# Patient Record
Sex: Female | Born: 1980 | Race: White | Hispanic: No | Marital: Single | Smoking: Current every day smoker
Health system: Southern US, Community
[De-identification: ages and names within clinical notes are randomized; demographics above are authoritative.]

## PROBLEM LIST (undated history)

## (undated) HISTORY — PX: OVARY SURGERY: SHX727

## (undated) HISTORY — PX: CYSTECTOMY: SUR359

## (undated) HISTORY — PX: LIGAMENT REPAIR: SHX5444

## (undated) HISTORY — PX: APPENDECTOMY: SHX54

---
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
## 2003-10-21 ENCOUNTER — Emergency Department (HOSPITAL_COMMUNITY): Admission: EM | Admit: 2003-10-21 | Discharge: 2003-10-21 | Payer: Self-pay | Admitting: Emergency Medicine

## 2003-11-16 ENCOUNTER — Emergency Department (HOSPITAL_COMMUNITY): Admission: EM | Admit: 2003-11-16 | Discharge: 2003-11-16 | Payer: Self-pay | Admitting: Family Medicine

## 2006-12-19 ENCOUNTER — Emergency Department (HOSPITAL_COMMUNITY): Admission: EM | Admit: 2006-12-19 | Discharge: 2006-12-19 | Payer: Self-pay | Admitting: Emergency Medicine

## 2007-05-01 ENCOUNTER — Emergency Department (HOSPITAL_COMMUNITY): Admission: EM | Admit: 2007-05-01 | Discharge: 2007-05-01 | Payer: Self-pay | Admitting: Emergency Medicine

## 2007-07-10 ENCOUNTER — Emergency Department (HOSPITAL_COMMUNITY): Admission: EM | Admit: 2007-07-10 | Discharge: 2007-07-10 | Payer: Self-pay | Admitting: Emergency Medicine

## 2007-07-24 ENCOUNTER — Ambulatory Visit: Payer: Self-pay | Admitting: Obstetrics and Gynecology

## 2007-07-24 ENCOUNTER — Encounter: Payer: Self-pay | Admitting: Obstetrics and Gynecology

## 2007-07-25 ENCOUNTER — Ambulatory Visit: Payer: Self-pay | Admitting: Gynecology

## 2007-07-25 ENCOUNTER — Inpatient Hospital Stay (HOSPITAL_COMMUNITY): Admission: AD | Admit: 2007-07-25 | Discharge: 2007-08-02 | Payer: Self-pay | Admitting: Obstetrics & Gynecology

## 2007-07-25 ENCOUNTER — Encounter: Payer: Self-pay | Admitting: Obstetrics & Gynecology

## 2007-08-07 ENCOUNTER — Ambulatory Visit: Payer: Self-pay | Admitting: Obstetrics & Gynecology

## 2007-09-11 ENCOUNTER — Ambulatory Visit: Payer: Self-pay | Admitting: Obstetrics & Gynecology

## 2008-05-22 ENCOUNTER — Emergency Department (HOSPITAL_COMMUNITY): Admission: EM | Admit: 2008-05-22 | Discharge: 2008-05-22 | Payer: Self-pay | Admitting: Family Medicine

## 2009-11-16 ENCOUNTER — Emergency Department (HOSPITAL_COMMUNITY): Admission: EM | Admit: 2009-11-16 | Discharge: 2009-11-16 | Payer: Self-pay | Admitting: Emergency Medicine

## 2010-03-06 ENCOUNTER — Encounter: Payer: Self-pay | Admitting: *Deleted

## 2010-04-28 LAB — CBC
MCV: 93.9 fL (ref 78.0–100.0)
Platelets: 119 10*3/uL — ABNORMAL LOW (ref 150–400)
RBC: 3.94 MIL/uL (ref 3.87–5.11)
RDW: 11.8 % (ref 11.5–15.5)
WBC: 8.2 10*3/uL (ref 4.0–10.5)

## 2010-04-28 LAB — DIFFERENTIAL
Basophils Absolute: 0 10*3/uL (ref 0.0–0.1)
Eosinophils Relative: 2 % (ref 0–5)
Lymphocytes Relative: 14 % (ref 12–46)
Lymphs Abs: 1.1 10*3/uL (ref 0.7–4.0)
Neutro Abs: 6.4 10*3/uL (ref 1.7–7.7)

## 2010-04-28 LAB — BASIC METABOLIC PANEL
Chloride: 112 mEq/L (ref 96–112)
Creatinine, Ser: 0.64 mg/dL (ref 0.4–1.2)
GFR calc Af Amer: >60 mL/min (ref >60)
GFR calc non Af Amer: >60 mL/min (ref >60)
Potassium: 4.1 mEq/L (ref 3.5–5.1)

## 2010-04-28 LAB — GLUCOSE, CAPILLARY

## 2010-04-28 LAB — POCT PREGNANCY, URINE: Preg Test, Ur: NEGATIVE

## 2010-05-25 LAB — POCT PREGNANCY, URINE: Preg Test, Ur: NEGATIVE

## 2010-05-25 LAB — POCT URINALYSIS DIP (DEVICE)
Bilirubin Urine: NEGATIVE
Glucose, UA: NEGATIVE mg/dL
Ketones, ur: 15 mg/dL — AB
Protein, ur: 100 mg/dL — AB
Specific Gravity, Urine: 1.02 (ref 1.005–1.030)

## 2010-06-28 NOTE — Group Therapy Note (Signed)
[name], [name] NO.:  [id number]   MEDICAL RECORD NO.:  [id number]          PATIENT TYPE:  WOC   LOCATION:  [facility]                   FACILITY:  [facility]   PHYSICIAN:  Elsie Lincoln, MD      DATE OF BIRTH:  1980/12/28   DATE OF SERVICE:  08/07/2007                                  CLINIC NOTE   The patient is 30 year old female who was admitted on 07/25/2007 with  acute abdominal pain.  Transvaginal ultrasound was done and was called  as ovarian torsion, went in laparoscopically; however, it was PID with  bilateral hydrosalpinx and involvement of the appendix.  The patient  then had exploratory laparotomy, right salpingectomy, appendectomy and  lysis of adhesions.  Both ovaries were normal.  The fallopian tube also  had a hydrosalpinx/pyosalpinx.  However this tube was left alone.  The  patient was placed on antibiotics, Flagyl and cefoxitin, and also  received a dose of Rocephin IM for a positive gonorrhea.  The Flagyl was  treating the PID and also her Trichomonas which was found on Pap smear.  She developed a small bowel obstruction and required an NG tube for  several days.  General surgery was consulted.  The patient did well with  bowel rest and was discharged home approximately 8-9 days after her  surgery.  She is still on metronidazole, Cipro and doxy and oxycodone  for pain today.  The patient looks remarkably better.  She is passing  gas, having bowel movements.  No nausea or vomiting and all the swelling  is gone from her abdomen.  She has informed her partner of the positive  results and is no longer sexually active with him.  We talked about  fertility and that she is at risk for ectopic given her left fallopian  tube did have infection and she will need to seek early care once a  positive pregnancy test is noted by herself.   Temperature 99, pulse 94, blood pressure 141/87, weight 133.7, height 5  feet 5 inches.  GENERAL:  Well-nourished,  well-developed, in no apparent distress.  ABDOMEN:  Soft, nontender, nondistended, no rebound or guarding.  Steri-  Strips are worn and these were removed across the Pfannenstiel skin  incision.  Her laparoscopic incisions were healing well.  There is no  discharge from the incision.   ASSESSMENT AND PLAN:  A 30 year old female with pelvic inflammatory  disease and bilateral pyosalpinx with involvement of appendix.  The  patient is status post right salpingectomy and appendectomy.   1. Continue antibiotics.  2. Condoms always.  3. Return to clinic in 4 weeks for final postop visit.           ______________________________  Elsie Lincoln, MD     KL/MEDQ  D:  08/07/2007  T:  08/07/2007  Job:  [id number]

## 2010-06-28 NOTE — Op Note (Signed)
NAMEMERRICK, FEUTZ            ACCOUNT NO.:  [id number]   MEDICAL RECORD NO.:  [id number]          PATIENT TYPE:  INP   LOCATION:                                [facility]   PHYSICIAN:  Ginger Carne, MD  DATE OF BIRTH:  [birth year]   DATE OF PROCEDURE:  07/25/2007  DATE OF DISCHARGE:                               OPERATIVE REPORT   PREOPERATIVE DIAGNOSIS:  Right adnexal mass.   POSTOPERATIVE DIAGNOSES:  1. Appendicitis with possible rupture.  2. Bilateral hydrosalpinx.   PROCEDURE:  Appendectomy.   SURGEON:  Ginger Carne, MD   ASSISTANT:  Lesly Dukes, MD   ESTIMATED BLOOD LOSS:  Minimal.   COMPLICATIONS:  None immediate.   ANESTHESIA:  General.   SPECIMEN:  Appendix to pathology.   OPERATIVE FINDINGS:  Dr. Penne Lash requested assistance for management of  a right adnexal mass.  The distal ileum was affixed to the right adnexa.  This was secondary to inflammation caused by appendicitis with possible  rupture.  The distal ileum was easily separated from the right adnexa by  blunt dissection.  The mesentery was inflamed beneath the distal ileum;  however, there was no injury or violation to the bowel proper. The  portion of the distal ileum, approximately 4 to 5 cm in length, was  indurated where the appendix was attached to the mesentary and ileum.  The right tube was dilated consistent with hydrosalpinx and purulent  material emanated from its fimbria.  Dr. Penne Lash will dictate her  portion of the procedure and this dictation focuses principally on the  appendectomy and lysis of bowel adhesions.   OPERATIVE PROCEDURE:  After exploring the pelvic cavity, using a blunt  dissection, the distal ileum was easily separated from the right adnexa.  After inspection of said bowel to ensure no injury to said structure,  the distal portion of the appendix was intact and easily separated from  the distal ileum and the mesentery.  The mesoappendix was clamped,  cut  and ligated with 0 Vicryl suture at the base.  The two hemostats were  placed and the appendix was then severed between both hemostats.  A 2-0  Vicryl was used as a pursestring suture on the first tie to tie the base  and a second one over the first to assure fastness of the tie.  The tip  of the appendix was then cleansed with  Betadine, no active bleeding noted and the knots were secured.  The  mesoappendix showed no signs of bleeding.  Copious irrigation with  lactated Ringer's followed and Dr. Penne Lash will continue with the said  dictation as far as closure and management of the right salpinx.      Ginger Carne, MD  Electronically Signed     SHB/MEDQ  D:  07/25/2007  T:  07/26/2007  Job:  [id number]

## 2010-06-28 NOTE — Op Note (Signed)
[name], [name]            ACCOUNT NO.:  [id number]   MEDICAL RECORD NO.:  [id number]          PATIENT TYPE:  INP   LOCATION:  9319                          [facility]   PHYSICIAN:  Lesly Dukes, M.D. DATE OF BIRTH:  March 30, 1980   DATE OF PROCEDURE:  07/25/2007  DATE OF DISCHARGE:                               OPERATIVE REPORT   PREOPERATIVE DIAGNOSIS:  A 30 year old female with radiological  diagnosis of an 8-cm ovarian cyst, possible torsion.   POSTOPERATIVE DIAGNOSES:  Bilateral pyosalpinx, grossly normal ovaries  bilaterally, possible, and adhesive disease involving the bowel,  mesentery, fallopian tube, and posterior side of the uterus.   PROCEDURE:  1. Diagnostic laparoscopy.  2. Exploratory laparotomy.  3. Right salpingectomy.  4. Appendectomy.  5. Lysis of adhesions.   SURGEON:  Lesly Dukes, MD   ASSISTANT:  Ginger Carne, MD   ANESTHESIA:  General.   FINDINGS:  Bilateral pyosalpinx with grossly normal ovaries, small  simple cyst on the right ovary, inflamed and enlarged appendix, and  adhesive disease involving the right fallopian tube, ovary, uterus,  appendix, and bowel.   SPECIMENS:  Right fallopian tube and appendix to pathology.   ESTIMATED BLOOD LOSS:  50 mL.   COMPLICATIONS:  None.   PROCEDURE:  After informed consent was obtained, the patient was taken  to operating room where general anesthesia was induced.  The patient was  placed in dorsal lithotomy position and prepped and draped in a normal  sterile fashion.  A Foley was placed in the bladder and a hook clip was  placed in the cervix.  A diagnostic laparoscopy was performed.  Using  the open method, the camera was inserted into the umbilicus on direct  visualization and pneumoperitoneum was achieved.  A 5-mm trocar was  placed on the right lower quadrant of the abdomen under direct  visualization.  A blunt probe was placed into the abdomen and was found  that the bowel,  possible appendix at this point, and fallopian tube was  encasing what was thought to be an ovarian mass.  At this point, we have  decided to open and do a laparotomy, so we could take down all the bowel  adhesions.  Dr. Mia Creek, our surgeon at own staff who has appendectomy  privileges was called in.  The fascia at the umbilicus was closed with 0  Vicryl and the 2 skin incision were closed with 4-0 Vicryl in a  subcuticular fashion.  A Pfannenstiel skin incision was made at the  scalpel and carried down to the fascia.  The fascia was incised in the  midline and extended bilaterally.  The superior and inferior aspects of  the fascial incision were grasped with Kocher clamps, tented up,  dissected off sharply, and bluntly from the underlying layers of rectus  muscles.  The rectus muscles were separated in the midline.  The Veress  needle was identified and entered bluntly.  This incision was seen both  superiorly and inferiorly with good visualization of bladder.  Lenox Ahr retractor was placed into the abdomen and laps were used to  pack away the bowel.  Dr. Mia Creek then used blunt dissection to  separate the bowel from the fallopian tube.  At that point, the  pyosalpinx ruptured and we cultured the fluid.  The ovary was inspected  and found to be grossly normal except for a small simple cyst.  I  performed a salpingectomy by isolating the fallopian tube.  I ensured  that the ureter was not in the clamp by dissecting into the pelvic  sidewall and isolating the ureter.  Fallopian tube was clamped,  transected, and suture ligated with 0 Vicryl with good hemostasis.  At  this point, Dr. Mia Creek took over and did appendectomy and he has a  separate dictation for this.  At the end of this procedure, we copiously  irrigated the abdomen and everything was found to be hemostatic.  All  instruments and laps were removed from the patient's abdomen and again  hemostasis was assured on  all tissue planes.  The fascia was then closed  with 0 Vicryl in a running fashion with good hemostasis.  The  subcutaneous tissue was copiously irrigated and found to be hemostatic.  The skin was closed with staples and a dressing was placed on the  abdomen.  The Hulka clip was removed from the patient's vagina and there  was good hemostasis on the cervix.  The patient tolerated the procedure  well.  Sponge, lap, instrument, and needle count were correct x2.  The  patient was brought to the recovery room in stable condition.      Lesly Dukes, M.D.  Electronically Signed     KHL/MEDQ  D:  07/25/2007  T:  07/26/2007  Job:  [id number]

## 2010-06-28 NOTE — Discharge Summary (Signed)
[name], [name]            ACCOUNT NO.:  [id number]   MEDICAL RECORD NO.:  [id number]          PATIENT TYPE:  INP   LOCATION:  9319                          [facility]   PHYSICIAN:  Tanya S. Shawnie Pons, M.D.   DATE OF BIRTH:  1980/05/08   DATE OF ADMISSION:  07/25/2007  DATE OF DISCHARGE:  08/02/2007                               DISCHARGE SUMMARY   FINAL DIAGNOSES:  1. Acute abdominal pain.  2. Pelvic inflammatory disease.  3. Positive gonorrhea.  4. Tobacco abuse.  5. Postoperative ileus.   PERTINENT LABS:  Essentially normal electrolytes.  Normal hemoglobin.  Negative Chlamydia.  Positive gonorrhea.  Hepatitis B surface antigen  negative.  Hepatitis C antibody negative, HIV negative, RPR nonreactive,  and pelvic cultures negative.  Pelvic sonogram initially showed what was  possibly a torsed ovary and a large cyst on the right side.  Subsequent  radiology findings include acute abdominal series, which showed a  possible early small bowel obstruction versus ileus, need to follow up,  and eventually showed resolution of this problem.   CONSULTANTS:  Adolph Pollack, M.D., from General Surgery.   REASON FOR ADMISSION:  Briefly, please see the H and P on the chart.  The patient is a 30 year old nulligravida who came in with acute  abdominal pain and was thought to have a possibly torsed ovary.  The  patient was taken to the operating room, where upon entering the  abdomen, she was found to have acute PID, bilateral hydrosalpinx, and  adhesions of the right tube to the appendix.  Her right tube and the  appendix were taken out as well as lysis of adhesion.  Postoperatively,  she was transferred to the floor, where she initially did well.  However, she did develop on postoperative day #4 significant amount of  distention and abdominal tenderness, and nausea and vomiting.  She had a  postoperative ileus noted on exam.  An NG was placed.  This remained  down for 3 days and  was discontinued on postoperative day #7.  She was  passing gas, having bowel movements.  She was tolerating a regular diet.  On postoperative day #8, given how well she looked, it was felt she was  stable for discharge.  The patient was treated adequately for GC and  Chlamydia, and her PID was treated.  She was on IV antibiotics  throughout her hospitalization.  The patient remained afebrile  postoperatively.   DISCHARGE INSTRUCTIONS AND CONDITIONS:  The patient was discharged home  in improved condition.  Follow up OB/GYN Clinic on August 07, 2007, at  3:30 with Dr. Elsie Lincoln.   DISCHARGE MEDICATIONS:  1. Cipro 500 mg 1 p.o. b.i.d. for 7 days.  2. Doxycycline 100 mg 1 p.o. b.i.d. for 7 days.  3. Flagyl 500 mg 1 p.o. b.i.d. for 7 days.  4. Percocet 5/325 one-two p.o. q.4-6 h. p.r.n., # 20, with no refills      are given.   Her antibiotic should complete 14 days of PID treatment.  The patient is  instructed to return with persistent nausea, vomiting, significant  abdominal pain, or fever.      [name]. Shawnie Pons, M.D.  Electronically Signed     TSP/MEDQ  D:  08/02/2007  T:  08/02/2007  Job:  [id number]

## 2010-06-28 NOTE — Group Therapy Note (Signed)
[name], STROTHER NO.:  [id number]   MEDICAL RECORD NO.:  [id number]          PATIENT TYPE:  WOC   LOCATION:  [facility]                   FACILITY:  [facility]   PHYSICIAN:  Argentina Donovan, MD        DATE OF BIRTH:  January 08, 1981   DATE OF SERVICE:  07/24/2007                                  CLINIC NOTE   The patient is a 30 year old gravida 1, para 0-0-1-0 who came in because  of pelvic pain.  Her story is that she has had over the past year a  significant history of constipation, which has been very unusual for  her, and it one time took her into the emergency room.  This present  problem started 2 weeks ago when she was out jet skiing and fell off the  jet ski.  She did not feel like she hurt herself at all, and she got  back to land in short time.  Afterwards, she developed a nagging pain  around the paraumbilical area.  Her friend of hers had some Tylenol with  Codeine which she took.  After that, the pain got much, much worse, and  it radiated down to the right lower quadrant.  She began having some  nausea and went into the emergency room.  There, they got a CT scan  which was completely normal of the abdomen and pelvis with the exception  of a very small cyst on the left ovary.  She did have 14,000 white  cells, however.  She was at the end of her period at that point, and  there were 7-10 red cells in the urine, 7-10 white cells but this may  very well have been a contaminant from her bleeding as this was a clean-  catch and not a cath specimen.  Over this past couple of weeks, the pain  has ebbed at times and gotten worse at times, seems to have stopped in  the right lower quadrant.  When asked whether it would be superficial or  deep, she said some of both, also radiating into her back just behind  the area in the abdomen where the palpation seemed to indicate that her  pain was.   PHYSICAL EXAMINATION:  ABDOMEN:  Soft, flat, and nontender with no  masses,  no organomegaly, no guarding, and no rebound.  GENITAL:  The external genitalia was normal.  BUS within normal limits.  Vagina is clean and well rugated.  Cervix is clean and nulliparous.  The  uterus is of normal size and consistency.  The adnexa appeared normal to  palpation, although she had some tenderness in the area of the right  adnexa.  She said that she had had increased pain on several occasions  after coitus.  The patient's history sounds so much like she could have  had an appendicitis in the beginning, but certainly the CT was fairly  convincing that this was not the case.  At the present time, she states  that the pain is about a 6/8, but one of the other problems is chronic  constipation.  She says she has tried everything  and still just gets  like little seeds.  So, I am giving her a quarter of GoLYTELY.  I am  going to have her come back in a week, also getting an ultrasound of the  pelvis to see whether or not there is something there that the CT could  not pick up and evaluate the cyst on the left side, and an erythrocyte  sedimentation rate to see if there is any sign of inflammatory reaction.   IMPRESSION:  Pelvic pain of unknown etiology.           ______________________________  Argentina Donovan, MD     PR/MEDQ  D:  07/24/2007  T:  07/25/2007  Job:  [id number]

## 2010-06-28 NOTE — Consult Note (Signed)
[name], [name]            ACCOUNT NO.:  [id number]   MEDICAL RECORD NO.:  [id number]          PATIENT TYPE:  INP   LOCATION:  9319                          [facility]   PHYSICIAN:  [name], M.D.DATE OF BIRTH:  [birth year]   DATE OF CONSULTATION:  07/29/2007  DATE OF DISCHARGE:                                 CONSULTATION   PHYSICIAN REQUESTING CONSULTATION:  Stacy Dukes, MD.   REASON FOR ADMISSION:  Possible small bowel obstruction.   HISTORY OF PRESENT ILLNESS:  This is a 30 year old female who has been  having problems with right lower quadrant pain since the end of May.  She went to [facility], had an extensive workup including CT abdomen and  pelvis, no obvious pathology was found.  She has also been followed by  the gynecologist because she has had cystic ovaries.  She has had  sequential ultrasounds.  An ultrasound done on July 25, 2007,  demonstrated significant enlargement of the right ovary concerning for  torsion.  She subsequently was admitted at that time and taken for an  emergency exploratory laparotomy by Dr. Elsie Graves.  At that time,  she felt it was consistent with appendicitis and some inflammatory  change of the mesentery.  She asked Dr. Mia Graves to come in who  performed an appendectomy.  Today, postop day #4, she has been  complaining of abdominal distention, some nausea, and bilious vomiting.  She also has some upper abdominal burning.  She has had 2 small BMs and  passed a little gas.  A CT scan was done yesterday, showed no evidence  of bowel injury or abscess, and some dilated ileum and ascending colon.  Plain abdominal x-rays today demonstrate some dilated small bowel loops  as well and air fluid levels.  There was a progression of oral contrast  into the colon since a CT yesterday.  There was concern with a possible  partial small bowel obstruction, early small bowel obstruction, and for  this reason, I was asked to see  her.   PAST MEDICAL HISTORY:  Notable for gonorrhea.   PREVIOUS OPERATIONS/SURGERY:  An exploratory laparotomy with  appendectomy, right salpingectomy, and lysis of adhesions on July 25, 2007.   CURRENT MEDICATIONS:  Doxycycline, Dilaudid, Toradol, nicotine patch.  Also, some Zosyn for what appeared to be a ruptured appendix.  This is  based on the intraoperative report.   SOCIAL HISTORY:  Single.  Does smoke cigarettes and some marijuana.  She  occasionally drinks alcohol.  Works for home health.   REVIEW OF SYSTEMS:  CARDIOVASCULAR:  No hypertension or disease.  PULMONARY:  Denies asthma or pneumonia.  GI:  She denies any peptic  ulcer disease.  Denies diverticulitis.  Does have problems with  intermittent constipation.  GU:  No kidney stones.  Endocrine:  No  diabetes.  HEMATOLOGIC:  No bleeding disorder or blood clots.   PHYSICAL EXAMINATION:  GENERAL:  A slightly ill-appearing female, but  she is pleasant and cooperative.  VITAL SIGNS:  Temperature this morning was 98.6 and she has been  afebrile.  Her heart  rate is slightly elevated at 106, blood pressure is  129/93.  NECK:  Supple without masses.  RESPIRATORY:  Breath sounds are equal and clear.  CARDIOVASCULAR:  Slightly increased rate with a regular rhythm.  ABDOMEN:  Slightly firm, distended, and quiet.  Well-healed lower  midline scars present.  Mild tenderness just superior to the scar, but  no peritoneal signs.   LABORATORY DATA:  Normal electrolytes except for glucose of 156, white  count 11,900, and hemoglobin 14.5.  Culture; pelvic abscess/fluid, no  growth in 3 days.   IMPRESSION:  Postoperative ileus versus partial small bowel obstruction.  Given the fact that contrast did get through to the colon, I favor the  former versus the latter, although with an acute laboratory process,  this appendicitis certainly could be early partial small bowel  obstruction.  No peritoneal signs.  She does not appear to be  toxic at  this time.   RECOMMENDATIONS:  Bowel rest, nasogastric tube decompression, and serial  x-rays and laboratories.  We will follow with you.      [name], M.D.  Electronically Signed     TJR/MEDQ  D:  07/29/2007  T:  07/30/2007  Job:  [id number]   cc:   [name], M.D.

## 2010-10-29 ENCOUNTER — Emergency Department (HOSPITAL_COMMUNITY)
Admission: EM | Admit: 2010-10-29 | Discharge: 2010-10-29 | Disposition: A | Discharge status: Home / Self Care | Payer: BC Managed Care – PPO | Attending: Emergency Medicine | Admitting: Emergency Medicine

## 2010-10-29 DIAGNOSIS — R0982 Postnasal drip: Secondary | ICD-10-CM | POA: Insufficient documentation

## 2010-10-29 DIAGNOSIS — R059 Cough, unspecified: Secondary | ICD-10-CM | POA: Insufficient documentation

## 2010-10-29 DIAGNOSIS — J3489 Other specified disorders of nose and nasal sinuses: Secondary | ICD-10-CM | POA: Insufficient documentation

## 2010-10-29 DIAGNOSIS — R05 Cough: Secondary | ICD-10-CM | POA: Insufficient documentation

## 2010-10-29 DIAGNOSIS — J069 Acute upper respiratory infection, unspecified: Secondary | ICD-10-CM | POA: Insufficient documentation

## 2010-11-07 LAB — POCT URINALYSIS DIP (DEVICE)
Bilirubin Urine: NEGATIVE
Glucose, UA: NEGATIVE
Hgb urine dipstick: NEGATIVE
Nitrite: NEGATIVE
Operator id: 239701
Urobilinogen, UA: 0.2

## 2010-11-07 LAB — POCT PREGNANCY, URINE
Operator id: 239701
Preg Test, Ur: NEGATIVE

## 2010-11-09 LAB — URINALYSIS, ROUTINE W REFLEX MICROSCOPIC
Bilirubin Urine: NEGATIVE
Ketones, ur: 40 — AB
Nitrite: NEGATIVE
Protein, ur: NEGATIVE
pH: 6

## 2010-11-09 LAB — RPR: RPR Ser Ql: NONREACTIVE

## 2010-11-09 LAB — DIFFERENTIAL
Basophils Absolute: 0
Basophils Relative: 0
Eosinophils Absolute: 0
Eosinophils Relative: 0
Neutrophils Relative %: 89 — ABNORMAL HIGH

## 2010-11-09 LAB — COMPREHENSIVE METABOLIC PANEL
ALT: 16
AST: 27
CO2: 25
Chloride: 101
Creatinine, Ser: 0.62
GFR calc Af Amer: >60
GFR calc non Af Amer: >60
Glucose, Bld: 90
Sodium: 136
Total Bilirubin: 0.6

## 2010-11-09 LAB — URINE MICROSCOPIC-ADD ON

## 2010-11-09 LAB — CBC
Hemoglobin: 13.8
MCV: 97.2
RBC: 4.16
WBC: 14 — ABNORMAL HIGH

## 2010-11-09 LAB — GC/CHLAMYDIA PROBE AMP, GENITAL: Chlamydia, DNA Probe: NEGATIVE

## 2010-11-09 LAB — LIPASE, BLOOD: Lipase: 13

## 2010-11-09 LAB — POCT PREGNANCY, URINE
Operator id: 272551
Preg Test, Ur: NEGATIVE

## 2010-11-10 LAB — URINALYSIS, ROUTINE W REFLEX MICROSCOPIC
Ketones, ur: 40 — AB
Leukocytes, UA: NEGATIVE
Nitrite: NEGATIVE
Protein, ur: NEGATIVE
Urobilinogen, UA: 0.2

## 2010-11-10 LAB — CBC
HCT: 41.3
MCHC: 34
MCV: 96.8
MCV: 97
Platelets: 223
Platelets: 284
Platelets: 311
RBC: 3.32 — ABNORMAL LOW
RDW: 12.1
WBC: 12.2 — ABNORMAL HIGH
WBC: 12.3 — ABNORMAL HIGH
WBC: 11.9 — ABNORMAL HIGH

## 2010-11-10 LAB — WET PREP, GENITAL
Clue Cells Wet Prep HPF POC: NONE SEEN
Yeast Wet Prep HPF POC: NONE SEEN

## 2010-11-10 LAB — CULTURE, ROUTINE-ABSCESS: Culture: NO GROWTH

## 2010-11-10 LAB — COMPREHENSIVE METABOLIC PANEL
ALT: 27
AST: 38 — ABNORMAL HIGH
Albumin: 3.4 — ABNORMAL LOW
Alkaline Phosphatase: 45
Chloride: 97
GFR calc Af Amer: >60
Potassium: 3.9
Sodium: 135
Total Bilirubin: 0.6

## 2010-11-10 LAB — HEPATITIS C ANTIBODY: HCV Ab: NEGATIVE

## 2010-11-10 LAB — BASIC METABOLIC PANEL
BUN: 3 — ABNORMAL LOW
Calcium: 8.6
Calcium: 8.9
Creatinine, Ser: 0.61
GFR calc Af Amer: >60
GFR calc non Af Amer: >60
GFR calc non Af Amer: >60
Glucose, Bld: 123 — ABNORMAL HIGH
Glucose, Bld: 150 — ABNORMAL HIGH
Sodium: 136

## 2010-11-10 LAB — HEPATITIS B SURFACE ANTIGEN: Hepatitis B Surface Ag: NEGATIVE

## 2010-11-10 LAB — RPR: RPR Ser Ql: NONREACTIVE

## 2010-11-10 LAB — ANAEROBIC CULTURE

## 2010-11-10 LAB — POCT PREGNANCY, URINE: Operator id: 292781

## 2011-04-29 ENCOUNTER — Emergency Department (HOSPITAL_COMMUNITY)
Admission: EM | Admit: 2011-04-29 | Discharge: 2011-04-29 | Disposition: A | Discharge status: Home / Self Care | Payer: BC Managed Care – PPO | Source: Home / Self Care | Attending: Family Medicine | Admitting: Family Medicine

## 2011-04-29 ENCOUNTER — Encounter (HOSPITAL_COMMUNITY): Payer: Self-pay | Admitting: Emergency Medicine

## 2011-04-29 DIAGNOSIS — J019 Acute sinusitis, unspecified: Secondary | ICD-10-CM

## 2011-04-29 MED ORDER — AMOXICILLIN-POT CLAVULANATE 875-125 MG PO TABS: 1.0000 | ORAL_TABLET | Freq: Two times a day (BID) | ORAL | Status: AC

## 2011-04-29 NOTE — ED Provider Notes (Signed)
History     CSN: [id number]  Arrival date & time 04/29/11  1513   First MD Initiated Contact with Patient 04/29/11 1522      Chief Complaint  Patient presents with  . Nasal Congestion  . Dizziness    (Consider location/radiation/quality/duration/timing/severity/associated sxs/prior treatment) Patient is a 31 y.o. female presenting with URI. The history is provided by the spouse.  URI The primary symptoms include headaches and cough. Primary symptoms do not include nausea or vomiting. The current episode started more than 1 week ago. This is a new problem. The problem has not changed since onset. Symptoms associated with the illness include sinus pressure, congestion and rhinorrhea.    History reviewed. No pertinent past medical history.  Past Surgical History  Procedure Date  . Appendectomy   . Cystectomy     right fallopian tube  . Ligament repair     Right Wrist    No family history on file.  History  Substance Use Topics  . Smoking status: Current Everyday Smoker -- 0.5 packs/day for 10 years    Types: Cigarettes  . Smokeless tobacco: Not on file  . Alcohol Use: 4.8 oz/week    1 Glasses of wine, 6 Cans of beer, 1 Shots of liquor per week    OB History    Grav Para Term Preterm Abortions TAB SAB Ect Mult Living                  Review of Systems  Constitutional: Negative.   HENT: Positive for congestion, rhinorrhea, postnasal drip and sinus pressure.   Eyes: Negative.   Respiratory: Positive for cough.   Gastrointestinal: Negative.  Negative for nausea and vomiting.  Neurological: Positive for headaches.    Allergies  Review of patient's allergies indicates no known allergies.  Home Medications   Current Outpatient Rx  Name Route Sig Dispense Refill  . AMOXICILLIN-POT CLAVULANATE 875-125 MG PO TABS Oral Take 1 tablet by mouth 2 (two) times daily. 20 tablet 0    BP 133/89  Pulse 80  Temp(Src) 98.9 F (37.2 C) (Oral)  Resp 18  SpO2 100%  LMP  04/06/2011  Physical Exam  Nursing note and vitals reviewed. Constitutional: She is oriented to person, place, and time. She appears well-developed and well-nourished.  HENT:  Head: Normocephalic.  Right Ear: External ear normal.  Left Ear: External ear normal.  Nose: Nose normal.  Mouth/Throat: Oropharynx is clear and moist.  Eyes: Conjunctivae and EOM are normal. Pupils are equal, round, and reactive to light.  Neck: Normal range of motion. Neck supple.  Cardiovascular: Normal rate, regular rhythm, normal heart sounds and intact distal pulses.   Pulmonary/Chest: Effort normal and breath sounds normal.  Lymphadenopathy:    She has no cervical adenopathy.  Neurological: She is alert and oriented to person, place, and time.  Skin: Skin is warm and dry.    ED Course  Procedures (including critical care time)  Labs Reviewed - No data to display No results found.   1. Sinusitis acute       MDM          Linna Hoff, MD 04/29/11 [phone]

## 2011-04-29 NOTE — ED Notes (Signed)
Pt has used OTC cold meds for roughly the last 7 days - sinus decongestants, pseudoephedrine, nasal sprays.

## 2011-04-29 NOTE — Discharge Instructions (Signed)
Take all of medicine, drink lots of fluids, no more smoking,  Use mucinex -d for sinus congestion..see your doctor if further problems

## 2011-04-29 NOTE — ED Notes (Signed)
Pt states sx started about 10 days ago with a sore throat. Sore throat resolved and the nasal congestion began about 8 days ago. Some sneezing.  Chills. No fever at home.

## 2011-04-29 NOTE — ED Notes (Signed)
Pt denies pain but is feeling pressure in her head/sinuses.

## 2011-08-12 ENCOUNTER — Emergency Department (HOSPITAL_COMMUNITY)
Admission: EM | Admit: 2011-08-12 | Discharge: 2011-08-12 | Disposition: A | Discharge status: Home / Self Care | Payer: BC Managed Care – PPO | Source: Home / Self Care | Attending: Emergency Medicine | Admitting: Emergency Medicine

## 2011-10-26 ENCOUNTER — Emergency Department (INDEPENDENT_AMBULATORY_CARE_PROVIDER_SITE_OTHER): Payer: BC Managed Care – PPO

## 2011-10-26 ENCOUNTER — Emergency Department (INDEPENDENT_AMBULATORY_CARE_PROVIDER_SITE_OTHER)
Admission: EM | Admit: 2011-10-26 | Discharge: 2011-10-26 | Disposition: A | Discharge status: Home / Self Care | Payer: BC Managed Care – PPO | Source: Home / Self Care | Attending: Emergency Medicine | Admitting: Emergency Medicine

## 2011-10-26 ENCOUNTER — Encounter (HOSPITAL_COMMUNITY): Payer: Self-pay | Admitting: Emergency Medicine

## 2011-10-26 DIAGNOSIS — K5289 Other specified noninfective gastroenteritis and colitis: Secondary | ICD-10-CM

## 2011-10-26 DIAGNOSIS — K529 Noninfective gastroenteritis and colitis, unspecified: Secondary | ICD-10-CM

## 2011-10-26 LAB — CBC WITH DIFFERENTIAL/PLATELET
Basophils Absolute: 0.1 10*3/uL (ref 0.0–0.1)
Basophils Relative: 1 % (ref 0–1)
Hemoglobin: 14.8 g/dL (ref 12.0–15.0)
Lymphocytes Relative: 26 % (ref 12–46)
MCHC: 34.9 g/dL (ref 30.0–36.0)
Monocytes Relative: 7 % (ref 3–12)
Neutro Abs: 3.8 10*3/uL (ref 1.7–7.7)
Neutrophils Relative %: 63 % (ref 43–77)
WBC: 6.1 10*3/uL (ref 4.0–10.5)

## 2011-10-26 LAB — POCT URINALYSIS DIP (DEVICE)
Bilirubin Urine: NEGATIVE
Glucose, UA: NEGATIVE mg/dL
Ketones, ur: NEGATIVE mg/dL
Nitrite: NEGATIVE
Protein, ur: NEGATIVE mg/dL
Specific Gravity, Urine: 1.02 (ref 1.005–1.030)
Urobilinogen, UA: 0.2 mg/dL (ref 0.0–1.0)
pH: 5.5 (ref 5.0–8.0)

## 2011-10-26 LAB — POCT PREGNANCY, URINE: Preg Test, Ur: NEGATIVE

## 2011-10-26 LAB — WET PREP, GENITAL: Yeast Wet Prep HPF POC: NONE SEEN

## 2011-10-26 MED ORDER — TRAMADOL HCL 50 MG PO TABS: 100.0000 mg | ORAL_TABLET | Freq: Three times a day (TID) | ORAL | Status: AC | PRN

## 2011-10-26 MED ORDER — DICYCLOMINE HCL 20 MG PO TABS: 20.0000 mg | ORAL_TABLET | Freq: Two times a day (BID) | ORAL | Status: DC

## 2011-10-26 NOTE — ED Notes (Signed)
C/o of epigastric that began yesterday. States would like a vaginal exam as boyfriend has been cheating on her C/o of whitish vag d/c "for months"

## 2011-10-26 NOTE — ED Provider Notes (Signed)
Chief Complaint  Patient presents with  . Abdominal Pain    History of Present Illness:    [name] is a 31 year old female with a three-day history of generalized abdominal pain. The pain comes and goes and feels like a sharp pain, rated 6/10 in intensity without radiation. Pain seems to be worse on the left than the right is worse if she moves or pushes on her abdomen and there are no specific relieving factors. It's been associated with some headache, nausea, and constipation. She has had no fever, chills, vomiting, or diarrhea. She had a normal bowel movement yesterday. No blood or mucus in the stool. She has had some abdominal bloating and some vaginal discharge. She denies any urinary symptoms or other GYN complaints. She is not sexually active. Last menstrual period was September 7. She has been celibate for the past 2 months. She suspects her boyfriend might have been unfaithful to her. She had a right fallopian were removed because of a cyst several years ago.  Review of Systems:  Other than noted above, the patient denies any of the following symptoms: Constitutional:  No fever, chills, fatigue, weight loss or anorexia. Lungs:  No cough or shortness of breath. Heart:  No chest pain, palpitations, syncope or edema.  No cardiac history. Abdomen:  No nausea, vomiting, hematememesis, melena, diarrhea, or hematochezia. GU:  No dysuria, frequency, urgency, or hematuria. Gyn:  No vaginal discharge, itching, abnormal bleeding, dyspareunia, or pelvic pain.  PMFSH:  Past medical history, family history, social history, meds, and allergies were reviewed along with nurse's notes.  No prior abdominal surgeries, past history of GI problems, STDs or GYN problems.  No history of aspirin or NSAID use.  No excessive alcohol intake.  Physical Exam:   Vital signs:  BP 130/83  Pulse 87  Temp 98.4 F (36.9 C) (Oral)  Resp 16  SpO2 98%  LMP 10/21/2011 Gen:  Alert, oriented, in no distress. Lungs:   Breath sounds clear and equal bilaterally.  No wheezes, rales or rhonchi. Heart:  Regular rhythm.  No gallops or murmers.   Abdomen:  Abdomen was soft, flat, nondistended. She had mild, generalized tenderness to palpation without guarding or rebound. There was no localized tenderness to palpation, although she identifies the left flank area as being the most tender area. There is no organomegaly or mass. Bowel sounds are normally active. Pelvic:  Normal left oh genitalia, vaginal and cervical mucosa were normal. There was no pain on cervical motion. No vaginal discharge. Uterus was midposition, normal in size and shape and nontender. No adnexal masses or tenderness. Skin:  Clear, warm and dry.  No rash.  Labs:   Results for orders placed during the hospital encounter of 10/26/11  CBC WITH DIFFERENTIAL      Component Value Range   WBC 6.1  4.0 - 10.5 K/uL   RBC 4.50  3.87 - 5.11 MIL/uL   Hemoglobin 14.8  12.0 - 15.0 g/dL   HCT 16.1  09.6 - 04.5 %   MCV 94.2  78.0 - 100.0 fL   MCH 32.9  26.0 - 34.0 pg   MCHC 34.9  30.0 - 36.0 g/dL   RDW 40.9  81.1 - 91.4 %   Platelets 199  150 - 400 K/uL   Neutrophils Relative 63  43 - 77 %   Neutro Abs 3.8  1.7 - 7.7 K/uL   Lymphocytes Relative 26  12 - 46 %   Lymphs Abs 1.6  0.7 - 4.0  K/uL   Monocytes Relative 7  3 - 12 %   Monocytes Absolute 0.4  0.1 - 1.0 K/uL   Eosinophils Relative 3  0 - 5 %   Eosinophils Absolute 0.2  0.0 - 0.7 K/uL   Basophils Relative 1  0 - 1 %   Basophils Absolute 0.1  0.0 - 0.1 K/uL  POCT URINALYSIS DIP (DEVICE)      Component Value Range   Glucose, UA NEGATIVE  NEGATIVE mg/dL   Bilirubin Urine NEGATIVE  NEGATIVE   Ketones, ur NEGATIVE  NEGATIVE mg/dL   Specific Gravity, Urine 1.020  1.005 - 1.030   Hgb urine dipstick TRACE (*) NEGATIVE   pH 5.5  5.0 - 8.0   Protein, ur NEGATIVE  NEGATIVE mg/dL   Urobilinogen, UA 0.2  0.0 - 1.0 mg/dL   Nitrite NEGATIVE  NEGATIVE   Leukocytes, UA TRACE (*) NEGATIVE  POCT PREGNANCY,  URINE      Component Value Range   Preg Test, Ur NEGATIVE  NEGATIVE     Radiology:  Dg Abd Acute W/chest  10/26/2011  *RADIOLOGY REPORT*  Clinical Data: Left lower quadrant abdominal pain, history of prior appendectomy  ACUTE ABDOMEN SERIES (ABDOMEN 2 VIEW & CHEST 1 VIEW)  Comparison: 08/01/2007  Findings: Normal heart size and vascularity.  Clear lungs. Negative for pneumonia, collapse, consolidation, edema, effusion, or pneumothorax.  Trachea midline.  Umbilical piercing noted.  No free air evident.  Nonobstructive bowel gas pattern.  Pelvic calcifications consistent with venous phleboliths.  No unexpected abdominal calcifications or osseous abnormality.  IMPRESSION: No acute finding by plain radiography   Original Report Authenticated By: Judie Petit. Ruel Favors, M.D.     Assessment:  The encounter diagnosis was Gastroenteritis.  Plan:   1.  The following meds were prescribed:   New Prescriptions   DICYCLOMINE (BENTYL) 20 MG TABLET    Take 1 tablet (20 mg total) by mouth 2 (two) times daily.   TRAMADOL (ULTRAM) 50 MG TABLET    Take 2 tablets (100 mg total) by mouth every 8 (eight) hours as needed for pain.   2.  The patient was instructed in symptomatic care and handouts were given. 3.  The patient was told to return if becoming worse in any way, if no better in 3 or 4 days, and given some red flag symptoms that would indicate earlier return.    Reuben Likes, MD 10/26/11 [phone]

## 2011-10-27 LAB — GC/CHLAMYDIA PROBE AMP, GENITAL: Chlamydia, DNA Probe: NEGATIVE

## 2011-11-01 ENCOUNTER — Telehealth (HOSPITAL_COMMUNITY): Payer: Self-pay | Admitting: *Deleted

## 2011-11-01 NOTE — ED Notes (Signed)
Pt. called for her lab results on VM. Pt. called back this afternoon.  Pt. verified x 2 and given results. (GC/Chlamydia neg., Wet prep: few WBC's).  Pt. said she still has genl. abd. discomfort off and on.  Pt. instructed to get a PCP or GYN to get further worked up that we can't do here.

## 2011-11-27 ENCOUNTER — Other Ambulatory Visit (HOSPITAL_COMMUNITY)
Admission: RE | Admit: 2011-11-27 | Discharge: 2011-11-27 | Disposition: A | Discharge status: Home / Self Care | Payer: BC Managed Care – PPO | Source: Ambulatory Visit | Attending: Family Medicine | Admitting: Family Medicine

## 2011-11-27 ENCOUNTER — Other Ambulatory Visit: Payer: Self-pay | Admitting: Physician Assistant

## 2011-11-27 DIAGNOSIS — Z124 Encounter for screening for malignant neoplasm of cervix: Secondary | ICD-10-CM | POA: Insufficient documentation

## 2011-12-15 ENCOUNTER — Telehealth (HOSPITAL_COMMUNITY): Payer: Self-pay | Admitting: *Deleted

## 2011-12-15 NOTE — ED Notes (Signed)
Pt. Called on VM about her FMLA papers.  I asked Dr. Lorenz Coaster and he said he thinks he already did them. I found them in a brown envelope up front in the accordion file with her name on it.  10/24 I called and left pt. a message on her VM. 11/1 I called pt. and left another message to call. [name] 12/15/2011

## 2013-01-07 ENCOUNTER — Encounter: Payer: Self-pay | Admitting: Women's Health

## 2013-01-07 ENCOUNTER — Ambulatory Visit (INDEPENDENT_AMBULATORY_CARE_PROVIDER_SITE_OTHER): Payer: BC Managed Care – PPO | Admitting: Women's Health

## 2013-01-07 VITALS — BP 114/72 | Ht 65.5 in | Wt 139.4 lb

## 2013-01-07 DIAGNOSIS — E079 Disorder of thyroid, unspecified: Secondary | ICD-10-CM

## 2013-01-07 DIAGNOSIS — Z113 Encounter for screening for infections with a predominantly sexual mode of transmission: Secondary | ICD-10-CM

## 2013-01-07 DIAGNOSIS — N943 Premenstrual tension syndrome: Secondary | ICD-10-CM

## 2013-01-07 DIAGNOSIS — Z9889 Other specified postprocedural states: Secondary | ICD-10-CM

## 2013-01-07 DIAGNOSIS — Z8742 Personal history of other diseases of the female genital tract: Secondary | ICD-10-CM | POA: Insufficient documentation

## 2013-01-07 DIAGNOSIS — Z01419 Encounter for gynecological examination (general) (routine) without abnormal findings: Secondary | ICD-10-CM

## 2013-01-07 MED ORDER — FLUOXETINE HCL 10 MG PO TABS: 10.0000 mg | ORAL_TABLET | Freq: Every day | ORAL | Status: DC

## 2013-01-07 NOTE — Addendum Note (Signed)
Addended by: Harrington Challenger on: 01/07/2013 02:59 PM   Modules accepted: Orders

## 2013-01-07 NOTE — Patient Instructions (Signed)

## 2013-01-07 NOTE — Progress Notes (Addendum)
[name] 1980/04/09 [id number]    History:    New patient presents for annual exam. Monthly 6-7 day cycle, same partner 2 years/condoms. Struggling with low-grade depression and PMS. Has had some counseling in the past denies need at this time. History of normal Paps. Cystectomy with right fallopian tube removed with questionable PID 2009.  Had normal biometrics at work.  Past medical history, past surgical history, family history and social history were all reviewed and documented in the EPIC chart. Works in [employer]. Was in the [employer], out 2008. Parents live in [place] and has minimal contact. Father hypertension.   ROS:  A  ROS was performed and pertinent positives and negatives are included in the history.  Exam:  Filed Vitals:   01/07/13 1109  BP: 114/72    General appearance:  Normal Head/Neck:  Normal, without cervical or supraclavicular adenopathy. Thyroid:  Symmetrical, normal in size, without palpable masses or nodularity. Respiratory  Effort:  Normal  Auscultation:  Clear without wheezing or rhonchi Cardiovascular  Auscultation:  Regular rate, without rubs, murmurs or gallops  Edema/varicosities:  Not grossly evident Abdominal  Soft,nontender, without masses, guarding or rebound.  Liver/spleen:  No organomegaly noted  Hernia:  None appreciated  Skin  Inspection:  Grossly normal  Palpation:  Grossly normal Neurologic/psychiatric  Orientation:  Normal with appropriate conversation.  Mood/affect:  Normal  Genitourinary    Breasts: Examined lying and sitting.     Right: Without masses, retractions, discharge or axillary adenopathy.     Left: Without masses, retractions, discharge or axillary adenopathy.   Inguinal/mons:  Normal without inguinal adenopathy  External genitalia:  Normal  BUS/Urethra/Skene's glands:  Normal  Bladder:  Normal  Vagina:  Normal  Cervix:  Normal  Uterus:   normal in size, shape and contour.  Midline and  mobile  Adnexa/parametria:     Rt: Without masses or tenderness.   Lt: Without masses or tenderness.  Anus and perineum: Normal  Digital rectal exam: Normal sphincter tone without palpated masses or tenderness  Assessment/Plan:  32 y.o.SWF G1P1  for annual exam.    PMS  Plan: Options reviewed, counseling encouraged, will try Prozac 10 mg daily, reviewed low-dose, prefers less or no medicine. Instructed to call if needs higher dose. If no relief, reviewed importance of counseling. SBE's, regular exercise, calcium rich diet, MVI daily encouraged. TSH. GC/Chl, denies need for HIV, hepatitis or RPR. Pap normal 2013, new screening guidelines reviewed. Contraception options reviewed and declined will continue condoms. Plan B emergency contraception reviewed.     Harrington Challenger [facility], 11:47 AM 01/07/2013

## 2013-03-12 ENCOUNTER — Ambulatory Visit (INDEPENDENT_AMBULATORY_CARE_PROVIDER_SITE_OTHER): Payer: BC Managed Care – PPO | Admitting: Women's Health

## 2013-03-12 ENCOUNTER — Encounter: Payer: Self-pay | Admitting: Women's Health

## 2013-03-12 DIAGNOSIS — Z309 Encounter for contraceptive management, unspecified: Secondary | ICD-10-CM

## 2013-03-12 DIAGNOSIS — IMO0001 Reserved for inherently not codable concepts without codable children: Secondary | ICD-10-CM

## 2013-03-12 DIAGNOSIS — IMO0002 Reserved for concepts with insufficient information to code with codable children: Secondary | ICD-10-CM

## 2013-03-12 DIAGNOSIS — N943 Premenstrual tension syndrome: Secondary | ICD-10-CM

## 2013-03-12 LAB — WET PREP FOR TRICH, YEAST, CLUE
Clue Cells Wet Prep HPF POC: NONE SEEN
Trich, Wet Prep: NONE SEEN
WBC, Wet Prep HPF POC: NONE SEEN
Yeast Wet Prep HPF POC: NONE SEEN

## 2013-03-12 MED ORDER — NORETHIN ACE-ETH ESTRAD-FE 1-20 MG-MCG PO TABS: 1.0000 | ORAL_TABLET | Freq: Every day | ORAL | Status: DC

## 2013-03-12 MED ORDER — FLUOXETINE HCL 10 MG PO TABS: 10.0000 mg | ORAL_TABLET | Freq: Every day | ORAL | Status: AC

## 2013-03-12 NOTE — Progress Notes (Signed)
Patient ID: Roderic ScarceChristina L Farquharson, female   DOB: 12/05/1980, 33 y.o.   MRN: [id number] Presents with complaint of pelvic cramping/tenderness after intercourse, questions if something is being hit during intercourse. Same partner years. Has had some problems with PMS and low-grade depression currently on Prozac 10 which is helping. States occasionally has some increased discharge denies itching or odor. Condoms for contraception. Denies urinary symptoms, fever, changes in elimination.  Exam: Appears well. External genitalia within normal limits, speculum exam scant white discharge vaginal walls  without erythema or odor noticed. Wet prep negative. Bimanual no CMT  or adnexal fullness, minimal tenderness  bilateral. Tense with exam.  Dyspareunia Contraception counseling  Plan: Options reviewed, will try Loestrin 1/20 prescription, proper use given and reviewed risk for blood clots and strokes. Start with next cycle continue condoms. Reviewed importance of relaxing with intercourse. GYN ultrasound, will schedule. Aware of hazards of smoking, and is trying to decrease/quit.

## 2013-03-12 NOTE — Addendum Note (Signed)
Addended by: Harrington ChallengerYOUNG, NANCY J on: 03/12/2013 12:09 PM   Modules accepted: Orders

## 2013-03-12 NOTE — Patient Instructions (Signed)
Dyspareunia Dyspareunia is pain during sexual intercourse. It is most common in women, but it also happens in men.  CAUSES  Female The pain from this condition is usually felt when anything is put into the vagina, but any part of the genitals may cause pain during sex. Even sitting or wearing pants can cause pain. Sometimes, a cause cannot be found. Some causes of pain during intercourse are:  Infections of the skin around the vagina.  Vaginal infections, such as a yeast, bacterial, or viral infection.  Vaginismus. This is the inability to have anything put in the vagina even when the woman wants it to happen. There is an automatic muscle contraction and pain. The pain of the muscle contraction can be so severe that intercourse is impossible.  Allergic reaction from spermicides, semen, condoms, scented tampons, soaps, douches, and vaginal sprays.  A fluid-filled sac (cyst) on the Bartholin or Skene glands, located at the opening of the vagina.  Scar tissue in the vagina from a surgically enlarged opening (episiotomy) or tearing after delivering a baby.  Vaginal dryness. This is more common in menopause. The normal secretions of the vagina are decreased. Changes in estrogen levels and increased difficulty becoming aroused can cause painful sex. Vaginal dryness can also happen when taking birth control pills.  Thinning of the tissue (atrophy) of the vulva and vagina. This makes the area thinner, smaller, unable to stretch to accommodate a penis, and prone to infection and tearing.  Vulvar vestibulitis or vestibulodynia.This is a condition that causes pain involving the area around the entrance to the vagina.The most common cause in [name] women is birth control pills.Women with low estrogen levels (postmenopausal women) may also experience this.Other causes include allergic reactions, too many nerve endings, skin conditions, and pelvic muscles that cannot relax.  Vulvar dermatoses. This  includes skin conditions such as lichen sclerosus and lichen planus.  Lack of foreplay to lubricate the vagina. This can cause vaginal dryness.  Noncancerous tumors (fibroids) in the uterus.  Uterus lining tissue growing outside the uterus (endometriosis).  Pregnancy that starts in the fallopian tube (tubal pregnancy).  Pregnancy or breastfeeding your baby. This can cause vaginal dryness.  A tilting or prolapse of the uterus. Prolapse is when weak and stretched muscles around the uterus allow it to fall into the vagina.  Problems with the ovaries, cysts, or scar tissue. This may be worse with certain sexual positions.  Previous surgeries causing adhesions or scar tissue in the vagina or pelvis.  Bladder and intestinal problems.  Psychological problems (such as depression or anxiety). This may make pain worse.  Negative attitudes about sex, experiencing rape, sexual assault, and misinformation about sex. These issues are often related to some types of pain.  Previous pelvic infection, causing scar tissue in the pelvis and on the female organs.  Cyst or tumor on the ovary.  Cancer of the female organs.  Certain medicines.  Medical problems such as diabetes, arthritis, or thyroid disease. Female In men, there are many physical causes of sexual discomfort. Some causes of pain during intercourse are:  Infections of the prostate, bladder, or seminal vesicles. This can cause pain after ejaculation.  An inflamed bladder (interstitial cystitis). This may cause pain from ejaculation.  Gonorrheal infections. This may cause pain during ejaculation.  An inflamed urethra (urethritis) or inflamed prostate (prostatitis). This can make genital stimulation painful or uncomfortable.  Deformities of the penis, such as Peyronie's disease.  A tight foreskin.  Cancer of the female organs.    Psychological problems. This may make pain worse. DIAGNOSIS   Your caregiver will take a history and  have you describe where the pain is located (outside the vagina, in the vagina, in the pelvis). You may be asked when you experience pain, such as with penetration or with thrusting.  Following this, your caregiver will do a physical exam. Let your caregiver know if the exam is too painful.  During the final part of the female exam, your caregiver will feel your uterus and ovaries with one hand on the abdomen and one finger in your vagina. This is a pelvic exam.  Blood tests, a Pap test, cultures for infection, an ultrasound test, and X-rays may be done. You may need to see a specialist for female problems (gynecologist).  Your caregiver may do a CT scan, MRI, or laparoscopy. Laparoscopy is a procedure to look into the pelvis with a lighted tube, through a cut (incision) in the abdomen. TREATMENT  Your caregiver can help you determine the best course of treatment. Sometimes, more testing is done. Continue with the suggested testing until your caregiver feels sure about your diagnosis and how to treat it. Sometimes, it is difficult to find the reason for the pain. The search for the cause and treatment can be frustrating. Treatment often takes several weeks to a few months before you notice any improvement. You may also need to avoid sexual activity until symptoms improve.Continuing to have sex when it hurts can delay healing and actually make the problem worse. The treatment depends on the cause of the pain. Treatment may include:  Medicines such as antibiotics, vaginal or skin creams, hormones, or antidepressants.  Minor or major surgery.  Psychological counseling or group therapy.  Kegel exercises and vaginal dilators to help certain cases of vaginismus (spasms). Do this only if recommended by your caregiver.Kegel exercises can make some problems worse.  Applying lubrication as recommended by your caregiver if you have dryness.  Sex therapy for you and your sex partner. It is common for  the pain to continue after the reason for the pain has been treated. Some reasons for this include a conditioned response. This means the person having the pain becomes so familiar with the pain that the pain continues as a response, even though the cause is removed. Sex therapy can help with this problem. HOME CARE INSTRUCTIONS   Follow your caregiver's instructions about taking medicines, tests, counseling, and follow-up treatment.  Do not use scented tampons, douches, vaginal sprays, or soaps.  Use water-based lubricants for dryness. Oil lubricants can cause irritation.  Do not use spermicides or condoms that irritate you.  Openly discuss with your partner your sexual experience, your desires, foreplay, and different sexual positions for a more comfortable and enjoyable sexual relationship.  Join group sessions for therapy, if needed.  Practice safe sex at all times.  Empty your bladder before having intercourse.  Try different positions during sexual intercourse.  Take over-the-counter pain medicine recommended by your caregiver before having sexual intercourse.  Do not wear pantyhose. Knee-high and thigh-high hose are okay.  Avoid scrubbing your vulva with a washcloth. Wash the area gently and pat dry with a towel. SEEK MEDICAL CARE IF:   You develop vaginal bleeding after sexual intercourse.  You develop a lump at the opening of your vagina, even if it is not painful.  You have abnormal vaginal discharge.  You have vaginal dryness.  You have itching or irritation of the vulva or vagina.  You   develop a rash or reaction to your medicine. SEEK IMMEDIATE MEDICAL CARE IF:   You develop severe abdominal pain during or shortly after sexual intercourse. You could have a ruptured ovarian cyst or ruptured tubal pregnancy.  You have a fever.  You have painful or bloody urination.  You have painful sexual intercourse, and you never had it before.  You pass out after having  sexual intercourse. Document Released: 02/19/2007 Document Revised: 04/24/2011 Document Reviewed: 05/02/2010 Exit[employer] Patient Information 2014 [employer].  

## 2013-03-21 ENCOUNTER — Ambulatory Visit (INDEPENDENT_AMBULATORY_CARE_PROVIDER_SITE_OTHER): Payer: BC Managed Care – PPO | Admitting: Women's Health

## 2013-03-21 ENCOUNTER — Ambulatory Visit (INDEPENDENT_AMBULATORY_CARE_PROVIDER_SITE_OTHER): Payer: BC Managed Care – PPO

## 2013-03-21 ENCOUNTER — Encounter: Payer: Self-pay | Admitting: Women's Health

## 2013-03-21 DIAGNOSIS — N83209 Unspecified ovarian cyst, unspecified side: Secondary | ICD-10-CM

## 2013-03-21 DIAGNOSIS — IMO0002 Reserved for concepts with insufficient information to code with codable children: Secondary | ICD-10-CM

## 2013-03-21 NOTE — Progress Notes (Signed)
Patient ID: Stacy ScarceChristina L Graves, female   DOB: [birth year], 33 y.o.   MRN: [id number] Presents for ultrasound. Has had problems with right-sided low abdominal pain, pelvic cramping and dyspareunia. Depression. Monthly cycles/Condoms. History of ovarian cysts, ruptured appendix/appendectomy age 33.  Ultrasound: No uterine abnormalities noted. Left ovary normal. Right ovary complex thickwalled vascular 17 x 13 mm consistent with corpus luteum cyst. Trace fluid seen in cul-de-sac.  Reviewed normality of ultrasound, reviewed functional/corpus luteum cyst. Instructed to call if change in pain.

## 2013-04-07 ENCOUNTER — Telehealth: Payer: Self-pay | Admitting: *Deleted

## 2013-04-07 NOTE — Telephone Encounter (Signed)
Pt started on birth control pills for the first time on OV 03/21/13 c/o cycle starting I explained to patient not abnormal to have some bleeding when taking pills due to being new medication and hormones. Pt was okay with this and knows to call if needed.

## 2013-04-29 ENCOUNTER — Encounter (HOSPITAL_COMMUNITY): Payer: Self-pay | Admitting: Emergency Medicine

## 2013-04-29 ENCOUNTER — Emergency Department (HOSPITAL_COMMUNITY)
Admission: EM | Admit: 2013-04-29 | Discharge: 2013-04-29 | Disposition: A | Discharge status: Home / Self Care | Payer: BC Managed Care – PPO | Source: Home / Self Care | Attending: Family Medicine | Admitting: Family Medicine

## 2013-04-29 DIAGNOSIS — J029 Acute pharyngitis, unspecified: Secondary | ICD-10-CM

## 2013-04-29 LAB — POCT RAPID STREP A: Streptococcus, Group A Screen (Direct): NEGATIVE

## 2013-04-29 MED ORDER — IPRATROPIUM BROMIDE 0.06 % NA SOLN: 2.0000 | Freq: Four times a day (QID) | NASAL | Status: AC

## 2013-04-29 MED ORDER — PREDNISONE 10 MG PO TABS: 30.0000 mg | ORAL_TABLET | Freq: Every day | ORAL | Status: DC

## 2013-04-29 NOTE — ED Provider Notes (Signed)
Roderic ScarceChristina L Graves is a 33 y.o. female who presents to Urgent Care today for 2-3 days of sore throat associated with headache. Mild cough and congestion. No nausea vomiting diarrhea. No significant fever. No chills present. No medications tried. Patient feels well otherwise.   History reviewed. No pertinent past medical history. History  Substance Use Topics  . Smoking status: Current Every Day Smoker -- 1.00 packs/day for 10 years    Types: Cigarettes  . Smokeless tobacco: Not on file  . Alcohol Use: 4.8 oz/week    1 Glasses of wine, 6 Cans of beer, 1 Shots of liquor per week   ROS as above Medications: No current facility-administered medications for this encounter.   Current Outpatient Prescriptions  Medication Sig Dispense Refill  . norethindrone-ethinyl estradiol (JUNEL FE,GILDESS FE,LOESTRIN FE) 1-20 MG-MCG tablet Take 1 tablet by mouth daily.  1 Package  11  . FLUoxetine (PROZAC) 10 MG tablet Take 1 tablet (10 mg total) by mouth daily.  30 tablet  6  . ipratropium (ATROVENT) 0.06 % nasal spray Place 2 sprays into both nostrils 4 (four) times daily.  15 mL  1  . predniSONE (DELTASONE) 10 MG tablet Take 3 tablets (30 mg total) by mouth daily.  15 tablet  0    Exam:  BP 141/85  Pulse 84  Temp(Src) 99.5 F (37.5 C) (Oral)  Resp 12  SpO2 100%  LMP 04/21/2013 Gen: Well NAD HEENT: EOMI,  MMM post pharynx with cobblestoning.  Lungs: Normal work of breathing. CTABL Heart: RRR no MRG Abd: NABS, Soft. NT, ND Exts: Brisk capillary refill, warm and well perfused.   Results for orders placed during the hospital encounter of 04/29/13 (from the past 24 hour(s))  POCT RAPID STREP A ([facility] ONLY)     Status: None   Collection Time    04/29/13  7:33 PM      Result Value Ref Range   Streptococcus, Group A Screen (Direct) NEGATIVE  NEGATIVE   No results found.  Assessment and Plan: 33 y.o. female with pharyngitis. Plan to treat with prednisone and Atrovent nasal spray. Throat  culture pending.  Discussed warning signs or symptoms. Please see discharge instructions. Patient expresses understanding.    Rodolph BongEvan S Corey, MD 04/29/13 [phone]

## 2013-04-29 NOTE — ED Notes (Signed)
C/o sore throat which started Sunday States she is fatigue, has a headache, and neck stiffness theraflu and sore throat OTC medication taking but no relief.

## 2013-04-29 NOTE — Discharge Instructions (Signed)
Thank you for coming in today. Take prednisone daily for 5 days Use Atrovent nasal spray Call or go to the emergency room if you get worse, have trouble breathing, have chest pains, or palpitations.  Pharyngitis Pharyngitis is redness, pain, and swelling (inflammation) of your pharynx.  CAUSES  Pharyngitis is usually caused by infection. Most of the time, these infections are from viruses (viral) and are part of a cold. However, sometimes pharyngitis is caused by bacteria (bacterial). Pharyngitis can also be caused by allergies. Viral pharyngitis may be spread from person to person by coughing, sneezing, and personal items or utensils (cups, forks, spoons, toothbrushes). Bacterial pharyngitis may be spread from person to person by more intimate contact, such as kissing.  SIGNS AND SYMPTOMS  Symptoms of pharyngitis include:   Sore throat.   Tiredness (fatigue).   Low-grade fever.   Headache.  Joint pain and muscle aches.  Skin rashes.  Swollen lymph nodes.  Plaque-like film on throat or tonsils (often seen with bacterial pharyngitis). DIAGNOSIS  Your health care provider will ask you questions about your illness and your symptoms. Your medical history, along with a physical exam, is often all that is needed to diagnose pharyngitis. Sometimes, a rapid strep test is done. Other lab tests may also be done, depending on the suspected cause.  TREATMENT  Viral pharyngitis will usually get better in 3 4 days without the use of medicine. Bacterial pharyngitis is treated with medicines that kill germs (antibiotics).  HOME CARE INSTRUCTIONS   Drink enough water and fluids to keep your urine clear or pale yellow.   Only take over-the-counter or prescription medicines as directed by your health care provider:   If you are prescribed antibiotics, make sure you finish them even if you start to feel better.   Do not take aspirin.   Get lots of rest.   Gargle with 8 oz of salt water  ( tsp of salt per 1 qt of water) as often as every 1 2 hours to soothe your throat.   Throat lozenges (if you are not at risk for choking) or sprays may be used to soothe your throat. SEEK MEDICAL CARE IF:   You have large, tender lumps in your neck.  You have a rash.  You cough up green, yellow-brown, or bloody spit. SEEK IMMEDIATE MEDICAL CARE IF:   Your neck becomes stiff.  You drool or are unable to swallow liquids.  You vomit or are unable to keep medicines or liquids down.  You have severe pain that does not go away with the use of recommended medicines.  You have trouble breathing (not caused by a stuffy nose). MAKE SURE YOU:   Understand these instructions.  Will watch your condition.  Will get help right away if you are not doing well or get worse. Document Released: 01/30/2005 Document Revised: 11/20/2012 Document Reviewed: 10/07/2012 [facility] Patient Information 2014 [place]Care, [place].

## 2013-05-01 LAB — CULTURE, GROUP A STREP

## 2013-05-02 ENCOUNTER — Emergency Department (INDEPENDENT_AMBULATORY_CARE_PROVIDER_SITE_OTHER)
Admission: EM | Admit: 2013-05-02 | Discharge: 2013-05-02 | Disposition: A | Discharge status: Home / Self Care | Payer: BC Managed Care – PPO | Source: Home / Self Care | Attending: Family Medicine | Admitting: Family Medicine

## 2013-05-02 ENCOUNTER — Encounter (HOSPITAL_COMMUNITY): Payer: Self-pay | Admitting: Emergency Medicine

## 2013-05-02 DIAGNOSIS — J069 Acute upper respiratory infection, unspecified: Secondary | ICD-10-CM

## 2013-05-02 NOTE — ED Notes (Signed)
Question in regards to work note from previous visit, notified dr Denyse Amasscorey

## 2013-05-02 NOTE — ED Provider Notes (Signed)
Stacy ScarceChristina L Warrior is a 33 y.o. female who presents to Urgent Care today for jitteriness. Patient was seen for the urgent care 3 days prior and diagnosed with viral pharyngitis. She was treated with prednisone for symptom control. The prednisone helped control her pain however she's experience jitteriness and headache. She has continued taking the prednisone. The sore throat has improved. No nausea vomiting diarrhea fevers or chills. Current smoker.   History reviewed. No pertinent past medical history. History  Substance Use Topics  . Smoking status: Current Every Day Smoker -- 1.00 packs/day for 10 years    Types: Cigarettes  . Smokeless tobacco: Not on file  . Alcohol Use: 4.8 oz/week    1 Glasses of wine, 6 Cans of beer, 1 Shots of liquor per week   ROS as above Medications: No current facility-administered medications for this encounter.   Current Outpatient Prescriptions  Medication Sig Dispense Refill  . FLUoxetine (PROZAC) 10 MG tablet Take 1 tablet (10 mg total) by mouth daily.  30 tablet  6  . ipratropium (ATROVENT) 0.06 % nasal spray Place 2 sprays into both nostrils 4 (four) times daily.  15 mL  1  . norethindrone-ethinyl estradiol (JUNEL FE,GILDESS FE,LOESTRIN FE) 1-20 MG-MCG tablet Take 1 tablet by mouth daily.  1 Package  11    Exam:  BP 147/94  Pulse 74  Temp(Src) 98.9 F (37.2 C) (Oral)  Resp 16  SpO2 100%  LMP 04/21/2013 Gen: Well NAD HEENT: EOMI,  MMM posterior pharynx cobblestoning. Normal tympanic membranes Lungs: Normal work of breathing. CTABL Heart: RRR no MRG Abd: NABS, Soft. NT, ND Exts: Brisk capillary refill, warm and well perfused.  Neuro: No tremor  No results found for this or any previous visit (from the past 24 hour(s)). No results found.  Assessment and Plan: 33 y.o. female with jitteriness is a side effect of prednisone. Advised discontinuation of prednisone. Continue Tylenol or ibuprofen. Continue Atrovent nasal spray. Recommended  following up with a primary care provider for elevated blood pressure.  Discussed warning signs or symptoms. Please see discharge instructions. Patient expresses understanding.    Rodolph BongEvan S Shelia Kingsberry, MD 05/02/13 2116

## 2013-05-02 NOTE — Discharge Instructions (Signed)
Thank you for coming in today. Come back as needed,.  STOP prednsione USE tylenol for pain as needed.  USe up to 2 aleve for pain as needed.  Follow up with your doctor about your blood pressure soon.  Call or go to the emergency room if you get worse, have trouble breathing, have chest pains, or palpitations.   Upper Respiratory Infection, Adult An upper respiratory infection (URI) is also sometimes known as the common cold. The upper respiratory tract includes the nose, sinuses, throat, trachea, and bronchi. Bronchi are the airways leading to the lungs. Most people improve within 1 week, but symptoms can last up to 2 weeks. A residual cough may last even longer.  CAUSES Many different viruses can infect the tissues lining the upper respiratory tract. The tissues become irritated and inflamed and often become very moist. Mucus production is also common. A cold is contagious. You can easily spread the virus to others by oral contact. This includes kissing, sharing a glass, coughing, or sneezing. Touching your mouth or nose and then touching a surface, which is then touched by another person, can also spread the virus. SYMPTOMS  Symptoms typically develop 1 to 3 days after you come in contact with a cold virus. Symptoms vary from person to person. They may include:  Runny nose.  Sneezing.  Nasal congestion.  Sinus irritation.  Sore throat.  Loss of voice (laryngitis).  Cough.  Fatigue.  Muscle aches.  Loss of appetite.  Headache.  Low-grade fever. DIAGNOSIS  You might diagnose your own cold based on familiar symptoms, since most people get a cold 2 to 3 times a year. Your caregiver can confirm this based on your exam. Most importantly, your caregiver can check that your symptoms are not due to another disease such as strep throat, sinusitis, pneumonia, asthma, or epiglottitis. Blood tests, throat tests, and X-rays are not necessary to diagnose a common cold, but they may  sometimes be helpful in excluding other more serious diseases. Your caregiver will decide if any further tests are required. RISKS AND COMPLICATIONS  You may be at risk for a more severe case of the common cold if you smoke cigarettes, have chronic heart disease (such as heart failure) or lung disease (such as asthma), or if you have a weakened immune system. The very young and very old are also at risk for more serious infections. Bacterial sinusitis, middle ear infections, and bacterial pneumonia can complicate the common cold. The common cold can worsen asthma and chronic obstructive pulmonary disease (COPD). Sometimes, these complications can require emergency medical care and may be life-threatening. PREVENTION  The best way to protect against getting a cold is to practice good hygiene. Avoid oral or hand contact with people with cold symptoms. Wash your hands often if contact occurs. There is no clear evidence that vitamin C, vitamin E, echinacea, or exercise reduces the chance of developing a cold. However, it is always recommended to get plenty of rest and practice good nutrition. TREATMENT  Treatment is directed at relieving symptoms. There is no cure. Antibiotics are not effective, because the infection is caused by a virus, not by bacteria. Treatment may include:  Increased fluid intake. Sports drinks offer valuable electrolytes, sugars, and fluids.  Breathing heated mist or steam (vaporizer or shower).  Eating chicken soup or other clear broths, and maintaining good nutrition.  Getting plenty of rest.  Using gargles or lozenges for comfort.  Controlling fevers with ibuprofen or acetaminophen as directed by your  caregiver.  Increasing usage of your inhaler if you have asthma. Zinc gel and zinc lozenges, taken in the first 24 hours of the common cold, can shorten the duration and lessen the severity of symptoms. Pain medicines may help with fever, muscle aches, and throat pain. A  variety of non-prescription medicines are available to treat congestion and runny nose. Your caregiver can make recommendations and may suggest nasal or lung inhalers for other symptoms.  HOME CARE INSTRUCTIONS   Only take over-the-counter or prescription medicines for pain, discomfort, or fever as directed by your caregiver.  Use a warm mist humidifier or inhale steam from a shower to increase air moisture. This may keep secretions moist and make it easier to breathe.  Drink enough water and fluids to keep your urine clear or pale yellow.  Rest as needed.  Return to work when your temperature has returned to normal or as your caregiver advises. You may need to stay home longer to avoid infecting others. You can also use a face mask and careful hand washing to prevent spread of the virus. SEEK MEDICAL CARE IF:   After the first few days, you feel you are getting worse rather than better.  You need your caregiver's advice about medicines to control symptoms.  You develop chills, worsening shortness of breath, or brown or red sputum. These may be signs of pneumonia.  You develop yellow or brown nasal discharge or pain in the face, especially when you bend forward. These may be signs of sinusitis.  You develop a fever, swollen neck glands, pain with swallowing, or white areas in the back of your throat. These may be signs of strep throat. SEEK IMMEDIATE MEDICAL CARE IF:   You have a fever.  You develop severe or persistent headache, ear pain, sinus pain, or chest pain.  You develop wheezing, a prolonged cough, cough up blood, or have a change in your usual mucus (if you have chronic lung disease).  You develop sore muscles or a stiff neck. Document Released: 07/26/2000 Document Revised: 04/24/2011 Document Reviewed: 06/03/2010 [facility]ExitCare Patient Information 2014 [place]ExitCare, MarylandLLC.

## 2013-05-02 NOTE — ED Notes (Signed)
Patient was seen Tuesday (3/17) for pharyngitis and treated with prednisone.  Patient reports feeling tingling in arms and legs, reports a blood pressure of 169/111.  Also c/o lightheadedness.

## 2013-05-07 ENCOUNTER — Telehealth (HOSPITAL_COMMUNITY): Payer: Self-pay | Admitting: Family Medicine

## 2013-05-07 NOTE — ED Notes (Signed)
Faxed back FMLA paperwork 3/25 8:16am to [phone]  Rodolph BongEvan S Corey, MD 05/07/13 [phone]

## 2013-12-15 ENCOUNTER — Encounter (HOSPITAL_COMMUNITY): Payer: Self-pay | Admitting: Emergency Medicine

## 2014-01-13 ENCOUNTER — Encounter: Payer: BC Managed Care – PPO | Admitting: Women's Health

## 2014-01-29 ENCOUNTER — Encounter: Payer: BC Managed Care – PPO | Admitting: Women's Health

## 2014-02-20 ENCOUNTER — Ambulatory Visit (INDEPENDENT_AMBULATORY_CARE_PROVIDER_SITE_OTHER): Payer: BLUE CROSS/BLUE SHIELD | Admitting: Women's Health

## 2014-02-20 ENCOUNTER — Encounter: Payer: Self-pay | Admitting: Women's Health

## 2014-02-20 ENCOUNTER — Other Ambulatory Visit (HOSPITAL_COMMUNITY)
Admission: RE | Admit: 2014-02-20 | Discharge: 2014-02-20 | Disposition: A | Discharge status: Home / Self Care | Payer: BLUE CROSS/BLUE SHIELD | Source: Ambulatory Visit | Attending: Women's Health | Admitting: Women's Health

## 2014-02-20 VITALS — BP 110/74 | Ht 66.0 in | Wt 144.0 lb

## 2014-02-20 DIAGNOSIS — Z1151 Encounter for screening for human papillomavirus (HPV): Secondary | ICD-10-CM | POA: Diagnosis present

## 2014-02-20 DIAGNOSIS — Z01419 Encounter for gynecological examination (general) (routine) without abnormal findings: Secondary | ICD-10-CM

## 2014-02-20 NOTE — Patient Instructions (Signed)

## 2014-02-20 NOTE — Progress Notes (Signed)
Stacy ScarceChristina L Graves 1981-01-28 [id number]    History:    Presents for annual exam.  Regular monthly cycle/condoms. Normal Pap history. 2009 questionable PID cystectomy. Same partner. Long-term history of anxiety/depression/PMS/ dyspareunia.  Past medical history, past surgical history, family history and social history were all reviewed and documented in the EPIC chart. Works in a planned. Minimal contact with family, family issues.  ROS:  A ROS was performed and pertinent positives and negatives are included.  Exam:  Filed Vitals:   02/20/14 1100  BP: 110/74    General appearance:  Normal Thyroid:  Symmetrical, normal in size, without palpable masses or nodularity. Respiratory  Auscultation:  Clear without wheezing or rhonchi Cardiovascular  Auscultation:  Regular rate, without rubs, murmurs or gallops  Edema/varicosities:  Not grossly evident Abdominal  Soft,nontender, without masses, guarding or rebound.  Liver/spleen:  No organomegaly noted  Hernia:  None appreciated  Skin  Inspection:  Grossly normal   Breasts: Examined lying and sitting.     Right: Without masses, retractions, discharge or axillary adenopathy.     Left: Without masses, retractions, discharge or axillary adenopathy. Gentitourinary   Inguinal/mons:  Normal without inguinal adenopathy  External genitalia:  Normal  BUS/Urethra/Skene's glands:  Normal  Vagina:  Normal  Cervix:  Normal  Uterus:   normal in size, shape and contour.  Midline and mobile  Adnexa/parametria:     Rt: Without masses or tenderness.   Lt: Without masses or tenderness.  Anus and perineum: Normal  Digital rectal exam: Normal sphincter tone without palpated masses or tenderness  Assessment/Plan:  34 y.o. S WF G0  for annual exam.     Anxiety/depression/dyspareunia Regular monthly cycle/condoms  Plan: Strongly encouraged counseling, [facility] name and number given instructed to schedule. Denies abuse or feelings of self-harm.  SBE's, regular exercise, calcium rich diet, MVI daily encouraged. Contraception options reviewed and declined will continue condoms. CBC, UA, Pap with HR HPV typing.   Harrington ChallengerYOUNG,[name] WHNP, 1:28 PM 02/20/2014

## 2014-02-21 LAB — URINALYSIS W MICROSCOPIC + REFLEX CULTURE
BACTERIA UA: NONE SEEN
BILIRUBIN URINE: NEGATIVE
Casts: NONE SEEN
Crystals: NONE SEEN
Glucose, UA: NEGATIVE mg/dL
Hgb urine dipstick: NEGATIVE
Ketones, ur: NEGATIVE mg/dL
Leukocytes, UA: NEGATIVE
NITRITE: NEGATIVE
PROTEIN: NEGATIVE mg/dL
Specific Gravity, Urine: 1.022 (ref 1.005–1.030)
UROBILINOGEN UA: 0.2 mg/dL (ref 0.0–1.0)
pH: 5.5 (ref 5.0–8.0)

## 2014-02-21 LAB — CBC WITH DIFFERENTIAL/PLATELET
BASOS PCT: 1 % (ref 0–1)
Basophils Absolute: 0.1 10*3/uL (ref 0.0–0.1)
EOS ABS: 0.3 10*3/uL (ref 0.0–0.7)
EOS PCT: 5 % (ref 0–5)
HCT: 42 % (ref 36.0–46.0)
Hemoglobin: 14.2 g/dL (ref 12.0–15.0)
LYMPHS PCT: 37 % (ref 12–46)
Lymphs Abs: 1.9 10*3/uL (ref 0.7–4.0)
MCH: 31.3 pg (ref 26.0–34.0)
MCHC: 33.8 g/dL (ref 30.0–36.0)
MCV: 92.7 fL (ref 78.0–100.0)
MONOS PCT: 7 % (ref 3–12)
MPV: 12.1 fL (ref 8.6–12.4)
Monocytes Absolute: 0.4 10*3/uL (ref 0.1–1.0)
Neutro Abs: 2.6 10*3/uL (ref 1.7–7.7)
Neutrophils Relative %: 50 % (ref 43–77)
PLATELETS: 185 10*3/uL (ref 150–400)
RBC: 4.53 MIL/uL (ref 3.87–5.11)
RDW: 12.4 % (ref 11.5–15.5)
WBC: 5.1 10*3/uL (ref 4.0–10.5)

## 2014-02-23 LAB — CYTOLOGY - PAP

## 2014-03-30 ENCOUNTER — Telehealth: Payer: Self-pay

## 2014-03-30 NOTE — Telephone Encounter (Signed)
Patient called asking if we still had blood specimen from 02/20/14 that we could run some "hormone tests" on.  She c/o breasts get tender right after she finishes period and other complaints. I told her we would not have that specimen. I recommended office visit to discuss with her provider and they could determine what, if any, tests are appropriate and order them accordingly.

## 2014-05-04 IMAGING — CR DG ABDOMEN ACUTE W/ 1V CHEST
3 series · 3 of 3 positions shown · non-contrast
Comparison: 08/01/2007

CLINICAL DATA: Left lower quadrant abdominal pain, history of prior
appendectomy

ACUTE ABDOMEN SERIES (ABDOMEN 2 VIEW & CHEST 1 VIEW)

[view not recorded (1 of 3)]
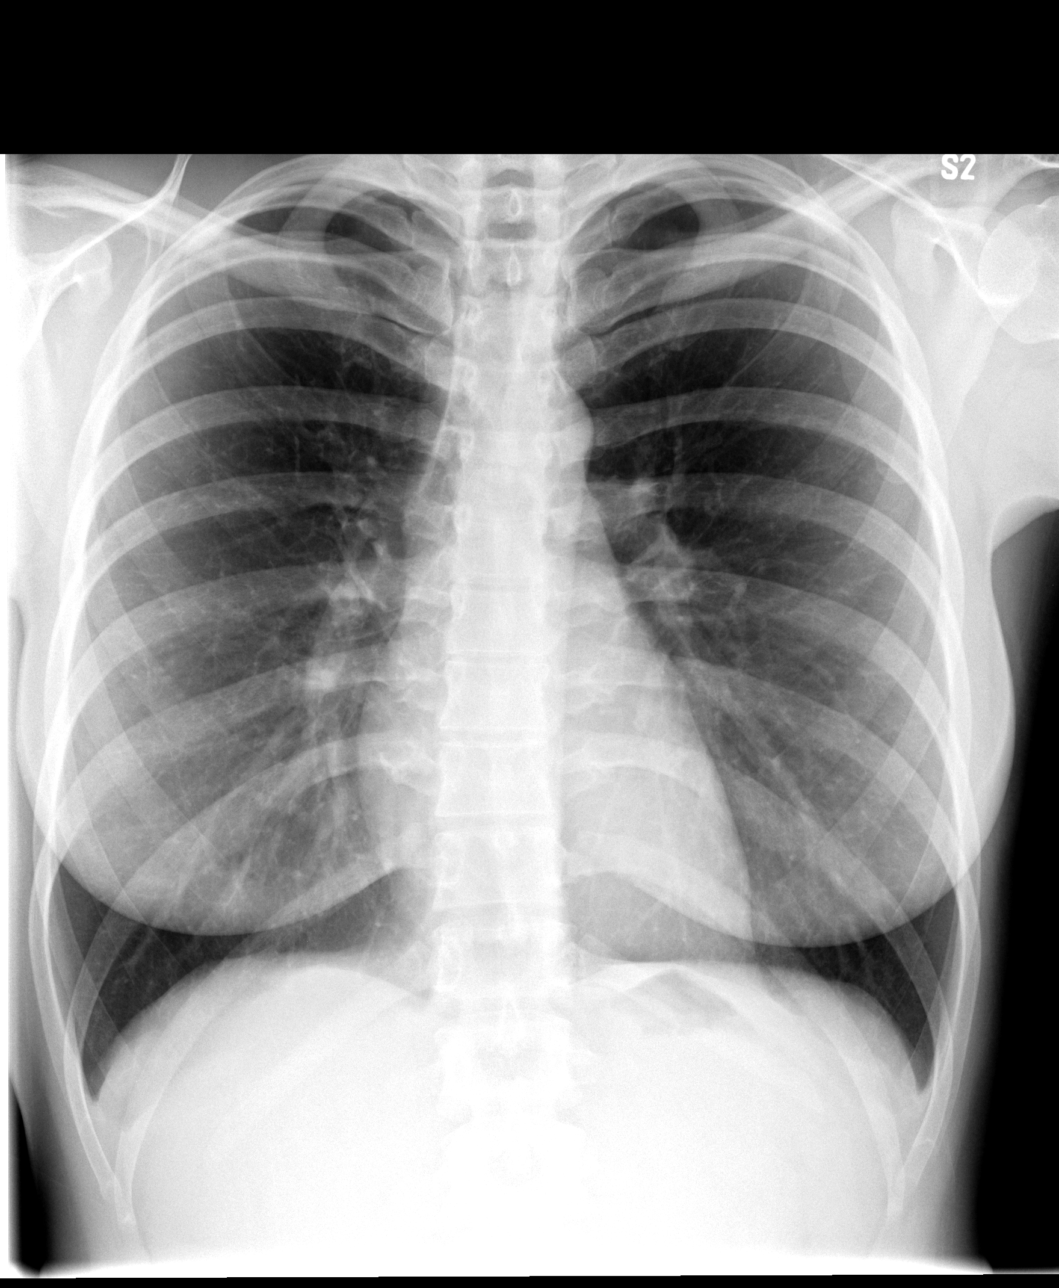

[view not recorded (2 of 3)]
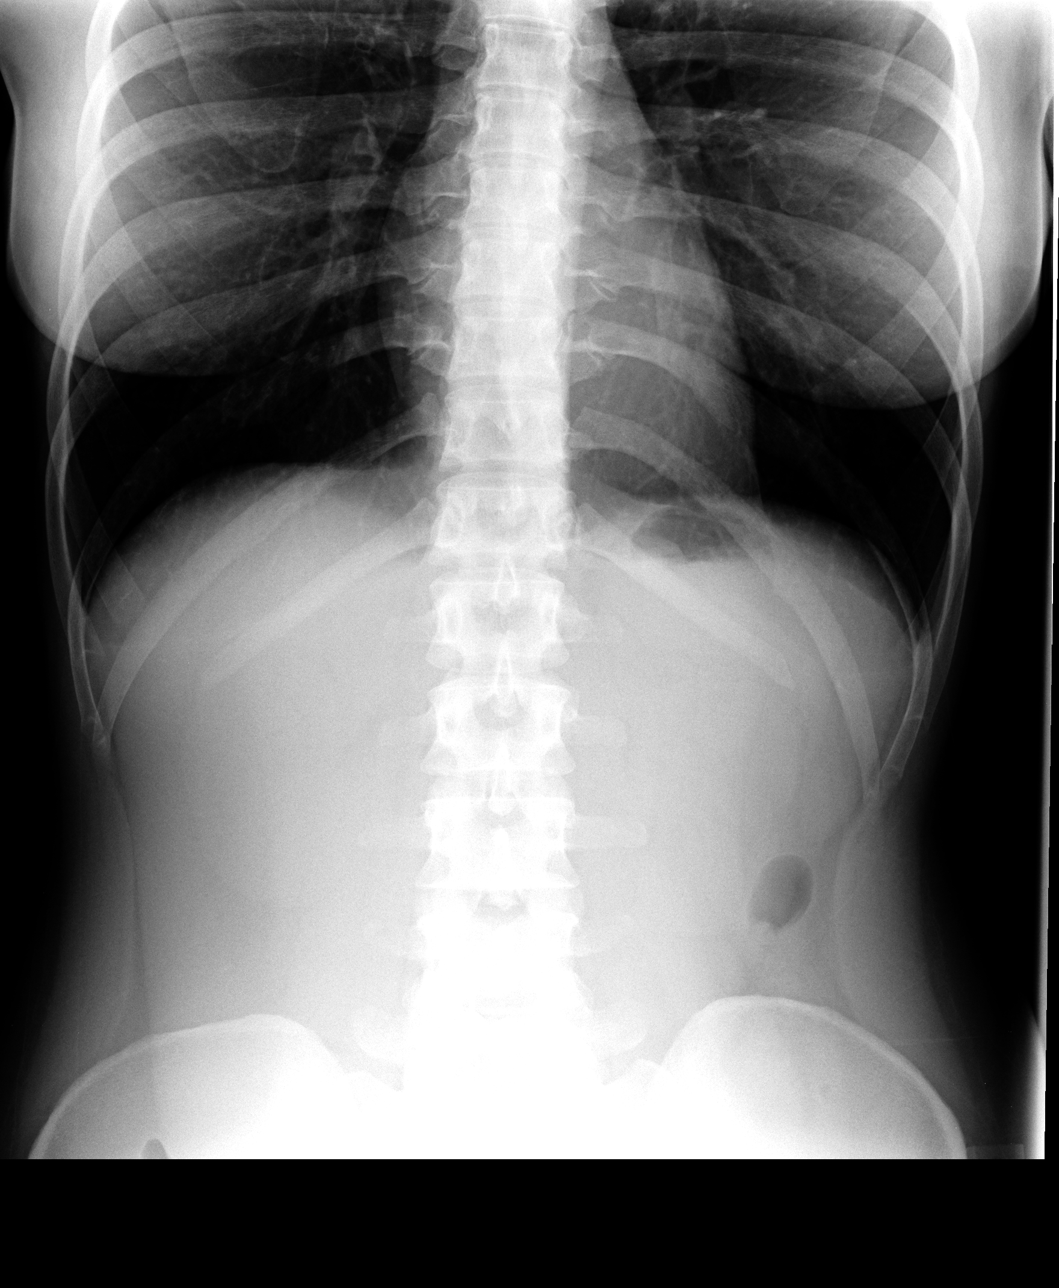

[view not recorded (3 of 3)]
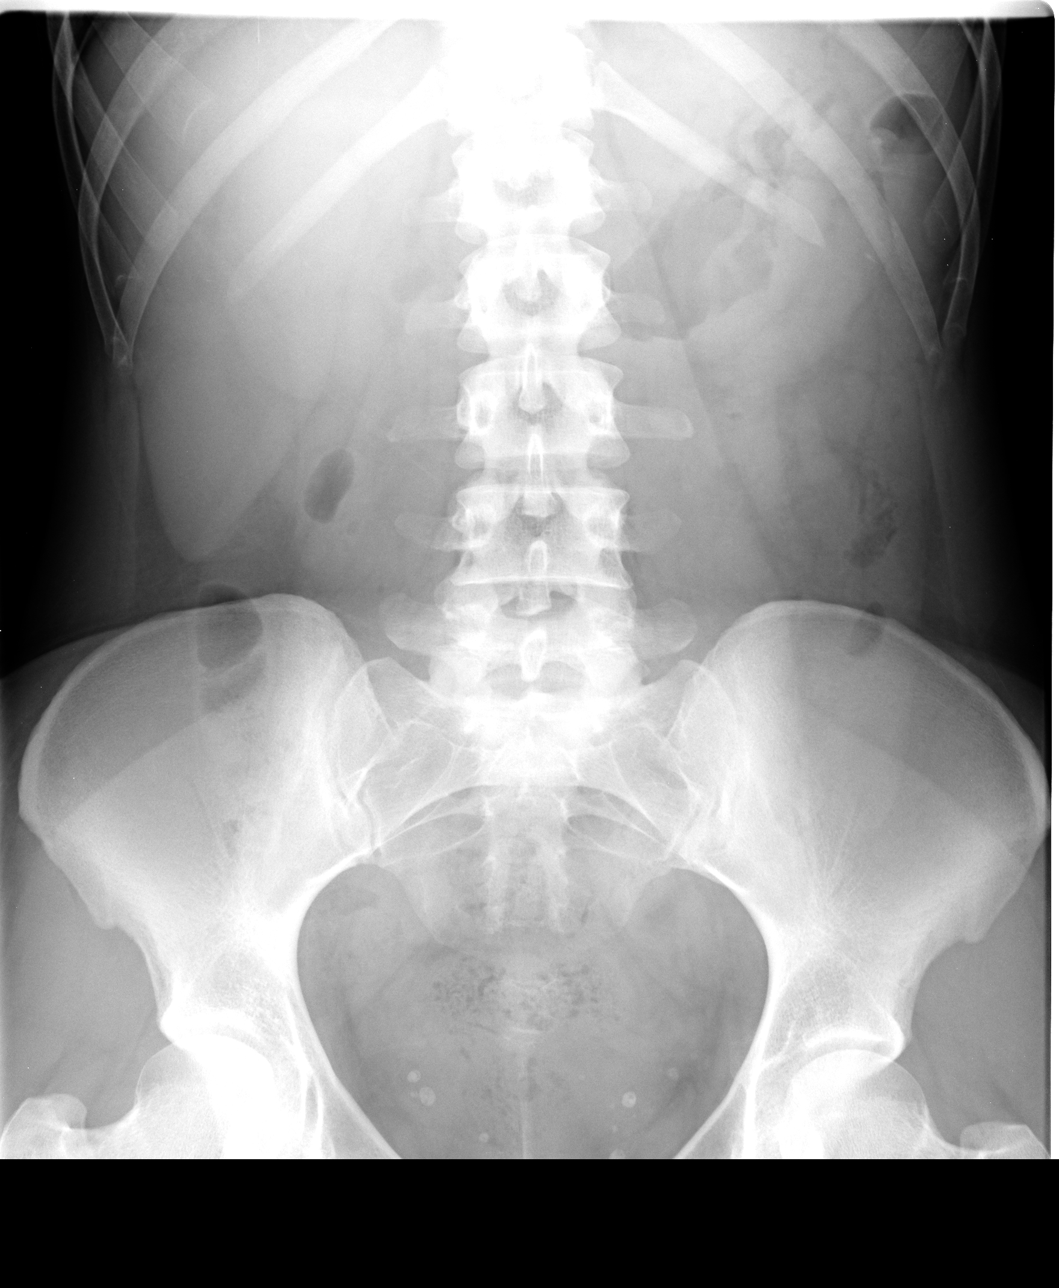

[3 of 3 positions shown; findings below may reference images not displayed]

FINDINGS: Normal heart size and vascularity.  Clear lungs.
Negative for pneumonia, collapse, consolidation, edema, effusion,
or pneumothorax.  Trachea midline.  Umbilical piercing noted.  No
free air evident.  Nonobstructive bowel gas pattern.  Pelvic
calcifications consistent with venous phleboliths.  No unexpected
abdominal calcifications or osseous abnormality.
IMPRESSION: No acute finding by plain radiography

## 2015-10-14 ENCOUNTER — Encounter: Payer: Self-pay | Admitting: Women's Health

## 2015-10-14 ENCOUNTER — Ambulatory Visit (INDEPENDENT_AMBULATORY_CARE_PROVIDER_SITE_OTHER): Payer: BLUE CROSS/BLUE SHIELD | Admitting: Women's Health

## 2015-10-14 VITALS — BP 115/76 | Ht 66.0 in | Wt 142.6 lb

## 2015-10-14 DIAGNOSIS — Z113 Encounter for screening for infections with a predominantly sexual mode of transmission: Secondary | ICD-10-CM | POA: Diagnosis not present

## 2015-10-14 DIAGNOSIS — Z1329 Encounter for screening for other suspected endocrine disorder: Secondary | ICD-10-CM | POA: Diagnosis not present

## 2015-10-14 DIAGNOSIS — Z01419 Encounter for gynecological examination (general) (routine) without abnormal findings: Secondary | ICD-10-CM | POA: Diagnosis not present

## 2015-10-14 LAB — CBC WITH DIFFERENTIAL/PLATELET
BASOS PCT: 1 %
Basophils Absolute: 60 cells/uL (ref 0–200)
EOS ABS: 180 {cells}/uL (ref 15–500)
Eosinophils Relative: 3 %
HEMATOCRIT: 44.3 % (ref 35.0–45.0)
Hemoglobin: 14.7 g/dL (ref 11.7–15.5)
LYMPHS PCT: 21 %
Lymphs Abs: 1260 cells/uL (ref 850–3900)
MCH: 31.7 pg (ref 27.0–33.0)
MCHC: 33.2 g/dL (ref 32.0–36.0)
MCV: 95.7 fL (ref 80.0–100.0)
MONO ABS: 480 {cells}/uL (ref 200–950)
MONOS PCT: 8 %
MPV: 12.4 fL (ref 7.5–12.5)
NEUTROS PCT: 67 %
Neutro Abs: 4020 cells/uL (ref 1500–7800)
PLATELETS: 163 10*3/uL (ref 140–400)
RBC: 4.63 MIL/uL (ref 3.80–5.10)
RDW: 13.1 % (ref 11.0–15.0)
WBC: 6 10*3/uL (ref 3.8–10.8)

## 2015-10-14 LAB — TSH: TSH: 2.71 mIU/L

## 2015-10-14 NOTE — Progress Notes (Signed)
Stacy ScarceChristina L Graves 28-Apr-1980 [id number]    History:    Presents for annual exam.  Monthly cycle/condoms occasionally. New [place] Normal Pap history. Did not receive gardasil. Has had problems with anxiety in the past on no medication denies problem. Reports cycle in June and July were different, reports lighter than usual, cycle in August normal.  Past medical history, past surgical history, family history and social history were all reviewed and documented in the EPIC chart. Minimal contact with parents poor relationship. [place], retired Hotel managermilitary, [place] Planning to go to nursing school.  ROS:  A ROS was performed and pertinent positives and negatives are included.  Exam:  Vitals:   10/14/15 0808  BP: 115/76  Weight: 142 lb 9.6 oz (64.7 kg)  Height: 5\' 6"  (1.676 m)   Body mass index is 23.02 kg/m.   General appearance:  Normal Thyroid:  Symmetrical, normal in size, without palpable masses or nodularity. Respiratory  Auscultation:  Clear without wheezing or rhonchi Cardiovascular  Auscultation:  Regular rate, without rubs, murmurs or gallops  Edema/varicosities:  Not grossly evident Abdominal  Soft,nontender, without masses, guarding or rebound.  Liver/spleen:  No organomegaly noted  Hernia:  None appreciated  Skin  Inspection:  Grossly normal   Breasts: Examined lying and sitting.     Right: Without masses, retractions, discharge or axillary adenopathy.     Left: Without masses, retractions, discharge or axillary adenopathy. Gentitourinary   Inguinal/mons:  Normal without inguinal adenopathy  External genitalia:  Normal  BUS/Urethra/Skene's glands:  Normal  Vagina:  Normal  Cervix:  Normal  Uterus:  normal in size, shape and contour.  Midline and mobile  Adnexa/parametria:     Rt: Without masses or tenderness.   Lt: Without masses or tenderness.  Anus and perineum: Normal  Digital rectal exam: Normal  sphincter tone without palpated masses or tenderness  Assessment/Plan:  [name] for annual exam with no complaints.  Monthly cycle/condoms Contraception management STD screen  Plan: Contraception options reviewed and declined pregnancy okay. SBEs, exercise, calcium rich diet, MVI daily encouraged. Safe pregnancy behaviors reviewed. CBC, TSH, UA, GC/Chlamydia, HIV, hep B, C, RPR. Pap normal with negative HR HPV 2016, new screening guidelines reviewed.    Harrington ChallengerYOUNG,NANC[facility] J Rice Medical CenterWHNP, 10:51 AM 10/14/2015

## 2015-10-14 NOTE — Patient Instructions (Signed)
Health Maintenance, Female Adopting a healthy lifestyle and getting preventive care can go a long way to promote health and wellness. Talk with your health care provider about what schedule of regular examinations is right for you. This is a good chance for you to check in with your provider about disease prevention and staying healthy. In between checkups, there are plenty of things you can do on your own. Experts have done a lot of research about which lifestyle changes and preventive measures are most likely to keep you healthy. Ask your health care provider for more information. WEIGHT AND DIET  Eat a healthy diet  Be sure to include plenty of vegetables, fruits, low-fat dairy products, and lean protein.  Do not eat a lot of foods high in solid fats, added sugars, or salt.  Get regular exercise. This is one of the most important things you can do for your health.  Most adults should exercise for at least 150 minutes each week. The exercise should increase your heart rate and make you sweat (moderate-intensity exercise).  Most adults should also do strengthening exercises at least twice a week. This is in addition to the moderate-intensity exercise.  Maintain a healthy weight  Body mass index (BMI) is a measurement that can be used to identify possible weight problems. It estimates body fat based on height and weight. Your health care provider can help determine your BMI and help you achieve or maintain a healthy weight.  For females 20 years of age and older:   A BMI below 18.5 is considered underweight.  A BMI of 18.5 to 24.9 is normal.  A BMI of 25 to 29.9 is considered overweight.  A BMI of 30 and above is considered obese.  Watch levels of cholesterol and blood lipids  You should start having your blood tested for lipids and cholesterol at 33 years of age, then have this test every 5 years.  You may need to have your cholesterol levels checked more often if:  Your lipid  or cholesterol levels are high.  You are older than 33 years of age.  You are at high risk for heart disease.  CANCER SCREENING   Lung Cancer  Lung cancer screening is recommended for adults 55-80 years old who are at high risk for lung cancer because of a history of smoking.  A yearly low-dose CT scan of the lungs is recommended for people who:  Currently smoke.  Have quit within the past 15 years.  Have at least a 30-pack-year history of smoking. A pack year is smoking an average of one pack of cigarettes a day for 1 year.  Yearly screening should continue until it has been 15 years since you quit.  Yearly screening should stop if you develop a health problem that would prevent you from having lung cancer treatment.  Breast Cancer  Practice breast self-awareness. This means understanding how your breasts normally appear and feel.  It also means doing regular breast self-exams. Let your health care provider know about any changes, no matter how small.  If you are in your 20s or 30s, you should have a clinical breast exam (CBE) by a health care provider every 1-3 years as part of a regular health exam.  If you are 40 or older, have a CBE every year. Also consider having a breast X-ray (mammogram) every year.  If you have a family history of breast cancer, talk to your health care provider about genetic screening.  If you   are at high risk for breast cancer, talk to your health care provider about having an MRI and a mammogram every year.  Breast cancer gene (BRCA) assessment is recommended for women who have family members with BRCA-related cancers. BRCA-related cancers include:  Breast.  Ovarian.  Tubal.  Peritoneal cancers.  Results of the assessment will determine the need for genetic counseling and BRCA1 and BRCA2 testing. Cervical Cancer Your health care provider may recommend that you be screened regularly for cancer of the pelvic organs (ovaries, uterus, and  vagina). This screening involves a pelvic examination, including checking for microscopic changes to the surface of your cervix (Pap test). You may be encouraged to have this screening done every 3 years, beginning at age 21.  For women ages 30-65, health care providers may recommend pelvic exams and Pap testing every 3 years, or they may recommend the Pap and pelvic exam, combined with testing for human papilloma virus (HPV), every 5 years. Some types of HPV increase your risk of cervical cancer. Testing for HPV may also be done on women of any age with unclear Pap test results.  Other health care providers may not recommend any screening for nonpregnant women who are considered low risk for pelvic cancer and who do not have symptoms. Ask your health care provider if a screening pelvic exam is right for you.  If you have had past treatment for cervical cancer or a condition that could lead to cancer, you need Pap tests and screening for cancer for at least 20 years after your treatment. If Pap tests have been discontinued, your risk factors (such as having a new sexual partner) need to be reassessed to determine if screening should resume. Some women have medical problems that increase the chance of getting cervical cancer. In these cases, your health care provider may recommend more frequent screening and Pap tests. Colorectal Cancer  This type of cancer can be detected and often prevented.  Routine colorectal cancer screening usually begins at 33 years of age and continues through 33 years of age.  Your health care provider may recommend screening at an earlier age if you have risk factors for colon cancer.  Your health care provider may also recommend using home test kits to check for hidden blood in the stool.  A small camera at the end of a tube can be used to examine your colon directly (sigmoidoscopy or colonoscopy). This is done to check for the earliest forms of colorectal  cancer.  Routine screening usually begins at age 50.  Direct examination of the colon should be repeated every 5-10 years through 33 years of age. However, you may need to be screened more often if early forms of precancerous polyps or small growths are found. Skin Cancer  Check your skin from head to toe regularly.  Tell your health care provider about any new moles or changes in moles, especially if there is a change in a mole's shape or color.  Also tell your health care provider if you have a mole that is larger than the size of a pencil eraser.  Always use sunscreen. Apply sunscreen liberally and repeatedly throughout the day.  Protect yourself by wearing long sleeves, pants, a wide-brimmed hat, and sunglasses whenever you are outside. HEART DISEASE, DIABETES, AND HIGH BLOOD PRESSURE   High blood pressure causes heart disease and increases the risk of stroke. High blood pressure is more likely to develop in:  People who have blood pressure in the high end   of the normal range (130-139/85-89 mm Hg).  People who are overweight or obese.  People who are African American.  If you are 38-23 years of age, have your blood pressure checked every 3-5 years. If you are 61 years of age or older, have your blood pressure checked every year. You should have your blood pressure measured twice--once when you are at a hospital or clinic, and once when you are not at a hospital or clinic. Record the average of the two measurements. To check your blood pressure when you are not at a hospital or clinic, you can use:  An automated blood pressure machine at a pharmacy.  A home blood pressure monitor.  If you are between 45 years and 39 years old, ask your health care provider if you should take aspirin to prevent strokes.  Have regular diabetes screenings. This involves taking a blood sample to check your fasting blood sugar level.  If you are at a normal weight and have a low risk for diabetes,  have this test once every three years after 33 years of age.  If you are overweight and have a high risk for diabetes, consider being tested at a younger age or more often. PREVENTING INFECTION  Hepatitis B  If you have a higher risk for hepatitis B, you should be screened for this virus. You are considered at high risk for hepatitis B if:  You were born in a country where hepatitis B is common. Ask your health care provider which countries are considered high risk.  Your parents were born in a high-risk country, and you have not been immunized against hepatitis B (hepatitis B vaccine).  You have HIV or AIDS.  You use needles to inject street drugs.  You live with someone who has hepatitis B.  You have had sex with someone who has hepatitis B.  You get hemodialysis treatment.  You take certain medicines for conditions, including cancer, organ transplantation, and autoimmune conditions. Hepatitis C  Blood testing is recommended for:  Everyone born from [birth year] through 1963.  Anyone with known risk factors for hepatitis C. Sexually transmitted infections (STIs)  You should be screened for sexually transmitted infections (STIs) including gonorrhea and chlamydia if:  You are sexually active and are younger than 33 years of age.  You are older than 33 years of age and your health care provider tells you that you are at risk for this type of infection.  Your sexual activity has changed since you were last screened and you are at an increased risk for chlamydia or gonorrhea. Ask your health care provider if you are at risk.  If you do not have HIV, but are at risk, it may be recommended that you take a prescription medicine daily to prevent HIV infection. This is called pre-exposure prophylaxis (PrEP). You are considered at risk if:  You are sexually active and do not regularly use condoms or know the HIV status of your partner(s).  You take drugs by injection.  You are sexually  active with a partner who has HIV. Talk with your health care provider about whether you are at high risk of being infected with HIV. If you choose to begin PrEP, you should first be tested for HIV. You should then be tested every 3 months for as long as you are taking PrEP.  PREGNANCY   If you are premenopausal and you may become pregnant, ask your health care provider about preconception counseling.  If you may  become pregnant, take 400 to 800 micrograms (mcg) of folic acid every day.  If you want to prevent pregnancy, talk to your health care provider about birth control (contraception). OSTEOPOROSIS AND MENOPAUSE   Osteoporosis is a disease in which the bones lose minerals and strength with aging. This can result in serious bone fractures. Your risk for osteoporosis can be identified using a bone density scan.  If you are 61 years of age or older, or if you are at risk for osteoporosis and fractures, ask your health care provider if you should be screened.  Ask your health care provider whether you should take a calcium or vitamin D supplement to lower your risk for osteoporosis.  Menopause may have certain physical symptoms and risks.  Hormone replacement therapy may reduce some of these symptoms and risks. Talk to your health care provider about whether hormone replacement therapy is right for you.  HOME CARE INSTRUCTIONS   Schedule regular health, dental, and eye exams.  Stay current with your immunizations.   Do not use any tobacco products including cigarettes, chewing tobacco, or electronic cigarettes.  If you are pregnant, do not drink alcohol.  If you are breastfeeding, limit how much and how often you drink alcohol.  Limit alcohol intake to no more than 1 drink per day for nonpregnant women. One drink equals 12 ounces of beer, 5 ounces of wine, or 1 ounces of hard liquor.  Do not use street drugs.  Do not share needles.  Ask your health care provider for help if  you need support or information about quitting drugs.  Tell your health care provider if you often feel depressed.  Tell your health care provider if you have ever been abused or do not feel safe at home.   This information is not intended to replace advice given to you by your health care provider. Make sure you discuss any questions you have with your health care provider.   Document Released: 08/15/2010 Document Revised: 02/20/2014 Document Reviewed: 01/01/2013 [employer] Interactive Patient Education [employer].

## 2015-10-15 LAB — RPR

## 2015-10-15 LAB — GC/CHLAMYDIA PROBE AMP
CT PROBE, AMP APTIMA: NOT DETECTED
GC Probe RNA: NOT DETECTED

## 2015-10-15 LAB — HIV ANTIBODY (ROUTINE TESTING W REFLEX): HIV: NONREACTIVE

## 2015-10-15 LAB — HEPATITIS B SURFACE ANTIGEN: HEP B S AG: NEGATIVE

## 2015-10-15 LAB — HEPATITIS C ANTIBODY: HCV AB: NEGATIVE
